# Patient Record
Sex: Female | Born: 1960 | Race: White | Hispanic: No | Marital: Married | Smoking: Former smoker
Health system: Southern US, Community
[De-identification: ages and names within clinical notes are randomized; demographics above are authoritative.]

## PROBLEM LIST (undated history)

## (undated) DIAGNOSIS — J189 Pneumonia, unspecified organism: Secondary | ICD-10-CM

## (undated) DIAGNOSIS — K589 Irritable bowel syndrome without diarrhea: Secondary | ICD-10-CM

## (undated) DIAGNOSIS — IMO0002 Reserved for concepts with insufficient information to code with codable children: Secondary | ICD-10-CM

## (undated) DIAGNOSIS — K449 Diaphragmatic hernia without obstruction or gangrene: Secondary | ICD-10-CM

## (undated) DIAGNOSIS — S060XAA Concussion with loss of consciousness status unknown, initial encounter: Secondary | ICD-10-CM

## (undated) DIAGNOSIS — H4020X0 Unspecified primary angle-closure glaucoma, stage unspecified: Secondary | ICD-10-CM

## (undated) DIAGNOSIS — S060X9A Concussion with loss of consciousness of unspecified duration, initial encounter: Secondary | ICD-10-CM

## (undated) HISTORY — DX: Reserved for concepts with insufficient information to code with codable children: IMO0002

## (undated) HISTORY — DX: Concussion with loss of consciousness of unspecified duration, initial encounter: S06.0X9A

## (undated) HISTORY — PX: OTHER SURGICAL HISTORY: SHX169

## (undated) HISTORY — DX: Pneumonia, unspecified organism: J18.9

## (undated) HISTORY — DX: Concussion with loss of consciousness status unknown, initial encounter: S06.0XAA

## (undated) HISTORY — DX: Irritable bowel syndrome, unspecified: K58.9

## (undated) HISTORY — DX: Unspecified primary angle-closure glaucoma, stage unspecified: H40.20X0

## (undated) HISTORY — DX: Diaphragmatic hernia without obstruction or gangrene: K44.9

## (undated) HISTORY — PX: IRIDOTOMY / IRIDECTOMY: SHX165

---

## 1994-02-07 HISTORY — PX: THYROGLOSSAL DUCT CYST: SHX297

## 2012-09-04 ENCOUNTER — Other Ambulatory Visit: Payer: Self-pay

## 2012-09-04 DIAGNOSIS — Z1231 Encounter for screening mammogram for malignant neoplasm of breast: Secondary | ICD-10-CM

## 2012-09-24 ENCOUNTER — Ambulatory Visit
Admission: RE | Admit: 2012-09-24 | Discharge: 2012-09-24 | Disposition: A | Discharge status: Home / Self Care | Payer: BC Managed Care – PPO | Source: Ambulatory Visit

## 2012-09-24 DIAGNOSIS — Z1231 Encounter for screening mammogram for malignant neoplasm of breast: Secondary | ICD-10-CM

## 2012-10-29 ENCOUNTER — Encounter: Payer: Self-pay | Admitting: Certified Nurse Midwife

## 2012-10-29 ENCOUNTER — Ambulatory Visit (INDEPENDENT_AMBULATORY_CARE_PROVIDER_SITE_OTHER): Payer: BC Managed Care – PPO | Admitting: Certified Nurse Midwife

## 2012-10-29 VITALS — BP 120/64 | HR 64 | Resp 16 | Ht 61.75 in | Wt 170.0 lb

## 2012-10-29 DIAGNOSIS — Z0000 Encounter for general adult medical examination without abnormal findings: Secondary | ICD-10-CM

## 2012-10-29 DIAGNOSIS — Z01419 Encounter for gynecological examination (general) (routine) without abnormal findings: Secondary | ICD-10-CM

## 2012-10-29 DIAGNOSIS — N952 Postmenopausal atrophic vaginitis: Secondary | ICD-10-CM

## 2012-10-29 NOTE — Progress Notes (Signed)
52 y.o. G42P0010 Married Caucasian Fe here for annual exam. Menopausal since 6/12. Denies vaginal bleeding. Complaining of pain with sexual activity and vaginal dryness. Has tried OTC products, but not with much success. Denies vaginal itching or burning, or change in vaginal discharge. o new personal products. PCP visit yesterday for aex,,with all labs.  Patient's last menstrual period was 07/09/2010.          Sexually active: yes  The current method of family planning is vasectomy.    Exercising: yes  cardio & weights Smoker:  no  Health Maintenance: Pap:  Yrs ago no abnormal MMG:  10-04-12 normal per patient Colonoscopy:  None, scheduled for consultation next week BMD:   none TDaP: 7/13 Labs: none Self breast exam: done occ Former smoker 21 years ago   reports that she has quit smoking. She does not have any smokeless tobacco history on file. She reports that she does not use illicit drugs.  Past Medical History  Diagnosis Date  . Dyspareunia     dryness    Past Surgical History  Procedure Laterality Date  . 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 Other surgical history      adenoids only age 8    Current Outpatient Prescriptions  Medication Sig Dispense Refill  . B Complex Vitamins (B COMPLEX PO) Take by mouth. Takes 2 daily      . desonide (DESOWEN) 0.05 % cream 2 (two) times daily as needed.      . Ibuprofen (ADVIL PO) Take by mouth as needed.      . Multiple Vitamins-Minerals (MULTIVITAMIN PO) Take by mouth daily.      . Probiotic Product (PROBIOTIC PO) Take by mouth daily.      Marland Kitchen UNABLE TO FIND daily. otc shaklee cholesterol reduction       No current facility-administered medications for this visit.    Family History  Problem Relation Age of Onset  . Hypertension Mother   . Diabetes Father   . Hypertension Father   . Heart disease Father   . Diabetes Maternal Uncle   . Hypertension Maternal Grandmother   . Stroke Maternal Grandmother   . Breast  cancer Paternal Grandmother   . Hypertension Paternal Grandmother   . Thyroid disease Paternal Grandmother     ROS:  Pertinent items are noted in HPI.  Otherwise, a comprehensive ROS was negative.  Exam:   BP 120/64  Pulse 64  Resp 16  Ht 5' 1.75" (1.568 m)  Wt 170 lb (77.111 kg)  BMI 31.36 kg/m2  LMP 07/09/2010 Height: 5' 1.75" (156.8 cm)  Ht Readings from Last 3 Encounters:  10/29/12 5' 1.75" (1.568 m)    General appearance: alert, cooperative and appears stated age Head: Normocephalic, without obvious abnormality, atraumatic Neck: no adenopathy, supple, symmetrical, trachea midline and thyroid normal to inspection and palpation Lungs: clear to auscultation bilaterally Breasts: normal appearance, no masses or tenderness, No nipple retraction or dimpling, No nipple discharge or bleeding, No axillary or supraclavicular adenopathy Heart: regular rate and rhythm Abdomen: soft, non-tender; no masses,  no organomegaly Extremities: extremities normal, atraumatic, no cyanosis or edema Skin: Skin color, texture, turgor normal. No rashes or lesions Lymph nodes: Cervical, supraclavicular, and axillary nodes normal. No abnormal inguinal nodes palpated Neurologic: Grossly normal   Pelvic: External genitalia:  no lesions              Urethra:  normal appearing urethra with no masses, tenderness or lesions  Bartholin's and Skene's: normal                 Vagina:atrophic appearing vagina with normal color and slight yellow discharge, no lesions ph 4.5 Wet prep taken              Cervix: normal, non tender              Pap taken: yes Bimanual Exam:  Uterus:  normal size, contour, position, consistency, mobility, non-tender and anteverted              Adnexa: normal adnexa and no mass, fullness, tenderness               Rectovaginal: Confirms               Anus:  normal sphincter tone, no lesions  Wet prep: Negative for Yeast, BV, Trich  A:  Well Woman with normal  exam  Menopausal no HRT  Atrophic Vaginitis  Family history of Breast cancer PGM(84)  Strong family history of hypertension   P:   Reviewed health and wellness pertinent to exam  Discussed importance of notification if vaginal bleeding  Discussed negative wet prep and findings of atrophic vaginitis. Discussed options for treatment of Estrogen cream, tablet, or ring, risks/benefits and treatment regimen.  Discussed OTC products of Replens, Vagisil moisturizer, Olive oil use. Patient prefers non extrogen product. Instructions given for Olive Oil with 3cc syringe, no needle(obtain form pharmacy) every hs for 2 weeks and then 3 times weekly.  Apply small amount at introitus daily.  Avoid intercourse for 2 weeks and then use moisturizer for sexual activity.  Re check here in 2 weeks.  Pap smear as per guidelines   Mammogram yearly pap smear taken today with HPVHR  counseled on breast self exam, mammography screening, menopause, adequate intake of calcium and vitamin D, diet and exercise, Kegel's exercises  return annually or prn as above  An After Visit Summary was printed and given to the patient.

## 2012-10-29 NOTE — Patient Instructions (Signed)

## 2012-10-30 NOTE — Progress Notes (Signed)
Note reviewed, agree with plan.  Jaculin Rasmus, MD  

## 2012-11-01 LAB — IPS PAP TEST WITH HPV

## 2012-11-28 ENCOUNTER — Ambulatory Visit (INDEPENDENT_AMBULATORY_CARE_PROVIDER_SITE_OTHER): Payer: BC Managed Care – PPO | Admitting: Certified Nurse Midwife

## 2012-11-28 ENCOUNTER — Encounter: Payer: Self-pay | Admitting: Certified Nurse Midwife

## 2012-11-28 VITALS — BP 130/64 | HR 80 | Ht 61.75 in | Wt 171.0 lb

## 2012-11-28 DIAGNOSIS — N952 Postmenopausal atrophic vaginitis: Secondary | ICD-10-CM

## 2012-11-28 MED ORDER — ESTRADIOL 10 MCG VA TABS: 1.0000 | ORAL_TABLET | VAGINAL | Status: DC

## 2012-11-28 NOTE — Progress Notes (Signed)
52 y.o. Married Caucasian female G1P0010 here for follow up of atrophic vaginitis treated with Olive oil daily for 3 weeks initiated on September 22,2014. Patient used as directed and still has dryness and pain with intercourse. Patient did not want estrogen use previously due to friends conversation, but has read herself and feels better about use if needed. Patient used OTC lubricant for sexual activity,but still had discomfort. Spouse also has ED, using medication. Patient complaining of irritation with scant discharge after sexual activity. No new personal products. [place]: Healthy WD,WN female Affect: normal, orientation x 3  Pelvic exam:EXTERNAL GENITALIA: vulva with no masses,  Atrophic appearance VAGINA: discharge scant, Wet Prep/KOH no pathogens, pH 4.5 and red, thin and atrophic with slight tenderness CERVIX: no lesions or cervical motion tenderness, normal appearance  A:Atrophic Vaginitis with Olive Oil use no change Negative wet prep   P: Discussed findings of negative wet prep and atrophic vaginitis. Discussed etiology of and reviewed handout with treatment option of estrogen use. Discussed risk and benefits, patient would like to try. Rx Vagifem see order, Instructions given to use nightly x 2 weeks and 2 x weekly, no sexual activity. Instructed to pat when cleaning after urination, instead of vigorous rubbing. Questions addressed.  RV recheck in 4 weeks

## 2012-12-02 NOTE — Progress Notes (Signed)
Note reviewed, agree with plan.  Esteban Kobashigawa, MD  

## 2012-12-31 ENCOUNTER — Ambulatory Visit (INDEPENDENT_AMBULATORY_CARE_PROVIDER_SITE_OTHER): Payer: BC Managed Care – PPO | Admitting: Certified Nurse Midwife

## 2012-12-31 VITALS — BP 110/60 | HR 68 | Resp 16 | Ht 61.75 in

## 2012-12-31 DIAGNOSIS — N952 Postmenopausal atrophic vaginitis: Secondary | ICD-10-CM

## 2012-12-31 NOTE — Progress Notes (Signed)
52 y.o. Married Caucasian female G1P0010 here for follow up atrophic vaginitis treated with Vagifem  initiated on 11/28/12. Patient using medication as prescribed and denies any problems with use. "So much better". Tried intercourse and had minimal pain or discomfort, did not use lubrication. Denies any vaginal bleeding or vaginal dryness, or breast tenderness. Happy with choice, desires continuance.   O: Healthy WD,WN female  Pelvic exam:EXTERNAL GENITALIA: normal appearing vulva with no masses, tenderness or lesions, atrophic appearance VAGINA: moist with slight increase of pink, slight tenderness, no lesions noted CERVIX: not tender, normal appearance   A: Atrophic vaginitis improving with Vagifem use desires continuance    P: Discussed findings of vaginal improvement and recommendation to continue use. Patient agreeable. Recommended vaginal moisturizer for sexual activity. Has Rx for Vagifem.    RV 6 months, prn

## 2013-01-02 NOTE — Progress Notes (Signed)
Note reviewed, agree with plan.  Akito Boomhower, MD  

## 2013-02-27 ENCOUNTER — Encounter: Payer: Self-pay | Admitting: Certified Nurse Midwife

## 2013-07-03 ENCOUNTER — Ambulatory Visit: Payer: BC Managed Care – PPO | Admitting: Certified Nurse Midwife

## 2013-07-09 ENCOUNTER — Ambulatory Visit: Payer: BC Managed Care – PPO | Admitting: Certified Nurse Midwife

## 2013-07-09 ENCOUNTER — Ambulatory Visit (INDEPENDENT_AMBULATORY_CARE_PROVIDER_SITE_OTHER): Payer: BC Managed Care – PPO | Admitting: Certified Nurse Midwife

## 2013-07-09 VITALS — BP 140/62 | HR 72 | Resp 16 | Ht 61.75 in | Wt 171.6 lb

## 2013-07-09 DIAGNOSIS — N952 Postmenopausal atrophic vaginitis: Secondary | ICD-10-CM

## 2013-07-09 NOTE — Patient Instructions (Signed)
Atrophic Vaginitis Atrophic vaginitis is a problem of low levels of estrogen in women. This problem can happen at any age. It is most common in women who have gone through menopause ("the change").  HOW WILL I KNOW IF I HAVE THIS PROBLEM? You may have:  Trouble with peeing (urinating), such as:  Going to the bathroom often.  A hard time holding your pee until you reach a bathroom.  Leaking pee.  Having pain when you pee.  Itching or a burning feeling.  Vaginal bleeding and spotting.  Pain during sex.  Dryness of the vagina.  A yellow, bad-smelling fluid (discharge) coming from the vagina. HOW WILL MY DOCTOR CHECK FOR THIS PROBLEM?  During your exam, your doctor will likely find the problem.  If there is a vaginal fluid, it may be checked for infection. HOW WILL THIS PROBLEM BE TREATED? Keep the vulvar skin as clean as possible. Moisturizers and lubricants can help with some of the symptoms. Estrogen replacement can help. There are 2 ways to take estrogen:  Systemic estrogen gets estrogen to your whole body. It takes many weeks or months before the symptoms get better.  You take an estrogen pill.  You use a skin patch. This is a patch that you put on your skin.  If you still have your uterus, your doctor may ask you to take a hormone. Talk to your doctor about the right medicine for you.  Estrogen cream.  This puts estrogen only at the part of your body where you apply it. The cream is put into the vagina or put on the vulvar skin. For some women, estrogen cream works faster than pills or the patch. CAN ALL WOMEN WITH THIS PROBLEM USE ESTROGEN? No. Women with certain types of cancer, liver problems, or problems with blood clots should not take estrogen. Your doctor can help you decide the best treatment for your symptoms. Document Released: 07/13/2007 Document Revised: 04/18/2011 Document Reviewed: 07/13/2007 [employer] Patient Information 2014 [employer].  

## 2013-07-09 NOTE — Progress Notes (Signed)
53 y.o. Married Caucasian female G1P0010 here for follow up of Atrophic vaginitis treated with Vagifem 10 mcg  initiated on October 22,2014. Using Vagifem as directed, but continues to have some dryness with sexual activity and other times. Denies vaginal itching or burning or new personal products or health changes. Patient has not been using vaginal moisturizer for sexual activity. "Feels it was working better at recheck in 11/14".  O: Healthy WD,WN female Affect: normal, orientation x 3 Abdomen:soft not tender Pelvic exam:EXTERNAL GENITALIA: normal appearing vulva with no masses, tenderness or lesions, slight atrophic appearance VAGINA: no abnormal discharge or lesions and moisture noted. Increased pink and  tenderness noted at introitus, patient states" this is the area of discomfort" CERVIX: no lesions or cervical motion tenderness and normal appearance UTERUS: mid and normal, non tender ADNEXA: no masses palpable and non tender  A: Atrophic vaginitis using Vagifem with good results Vaginal dryness at introitus    P: Discussed findings of improved vaginal appearance with Vagifem use and area of dryness shown to patient in the mirror. Discussed changing product she is using, but feel it is working appropriately, but need to increase frequency to 3 times weekly. Discussed importance of vaginal moisture for area of dryness and tenderness. Patient will try Olive Oil or Coconut oil daily and with sexual activity. Patient has Rx but will be using mail order and will advise of information so it can be sent to them. Patient to advise if this working well or not and will address when she needs new Rx or change. If patient needs to continue on Vagifem  3 times weekly will need cyclic progesterone twice yearly.   RV prn or if no change.

## 2013-07-10 ENCOUNTER — Telehealth: Payer: Self-pay | Admitting: Certified Nurse Midwife

## 2013-07-10 DIAGNOSIS — N952 Postmenopausal atrophic vaginitis: Secondary | ICD-10-CM

## 2013-07-11 NOTE — Telephone Encounter (Signed)
Pt calling to see if her prescription has been called into the pharmacy yet. [facility] [address] and they have not received it  Pt states she is completely out. Please call patient to let her know when this prescription willl be sent to the pharmacy

## 2013-07-11 NOTE — Telephone Encounter (Signed)
Last refill 11/28/12  #18/12 refills sent to [facility] [address]

## 2013-07-11 NOTE — Telephone Encounter (Signed)
[facility] [address]  Patient calling re: Vagifem will not be discounted for mail order. Patient requests RX be sent to local pharmacy instead.

## 2013-07-12 MED ORDER — ESTRADIOL 10 MCG VA TABS: 1.0000 | ORAL_TABLET | VAGINAL | Status: DC

## 2013-07-12 NOTE — Telephone Encounter (Signed)
Rx sent to [facility] [address] #12/ 3 refills to last until her appt 11/05/2013. - Patient notified.  - Ms Jessica Meza changed dose 07/09/13 to 3 times weekly that's why pt was out of Rx.

## 2013-07-12 NOTE — Telephone Encounter (Signed)
Pt calling to see if her prescription has been called into the pharmacy yet. [facility] [address] and they have not received it Pt states she is completely out. Please call patient to let her know when this prescription willl be sent to the pharmacy  Patient said that her dosage had been upped by Debbi and that is why she doesn't have anymore.

## 2013-07-24 ENCOUNTER — Encounter: Payer: Self-pay | Admitting: Certified Nurse Midwife

## 2013-07-24 NOTE — Progress Notes (Signed)
If ends up on three times weekly Vagifem, should probably use cyclic provera twice yearly.  Reviewed personally.  Lum KeasM. Suzanne Connelly [place], MD.

## 2013-09-13 ENCOUNTER — Other Ambulatory Visit: Payer: Self-pay

## 2013-09-13 DIAGNOSIS — Z1231 Encounter for screening mammogram for malignant neoplasm of breast: Secondary | ICD-10-CM

## 2013-09-25 ENCOUNTER — Ambulatory Visit
Admission: RE | Admit: 2013-09-25 | Discharge: 2013-09-25 | Disposition: A | Discharge status: Home / Self Care | Payer: BC Managed Care – PPO | Source: Ambulatory Visit

## 2013-09-25 DIAGNOSIS — Z1231 Encounter for screening mammogram for malignant neoplasm of breast: Secondary | ICD-10-CM

## 2013-10-08 ENCOUNTER — Emergency Department (HOSPITAL_COMMUNITY)
Admission: EM | Admit: 2013-10-08 | Discharge: 2013-10-08 | Disposition: A | Discharge status: Home / Self Care | Payer: BC Managed Care – PPO | Attending: Emergency Medicine | Admitting: Emergency Medicine

## 2013-10-08 ENCOUNTER — Encounter (HOSPITAL_COMMUNITY): Payer: Self-pay | Admitting: Emergency Medicine

## 2013-10-08 DIAGNOSIS — Z87448 Personal history of other diseases of urinary system: Secondary | ICD-10-CM | POA: Diagnosis not present

## 2013-10-08 DIAGNOSIS — Z87891 Personal history of nicotine dependence: Secondary | ICD-10-CM | POA: Diagnosis not present

## 2013-10-08 DIAGNOSIS — Y929 Unspecified place or not applicable: Secondary | ICD-10-CM | POA: Diagnosis not present

## 2013-10-08 DIAGNOSIS — Y9389 Activity, other specified: Secondary | ICD-10-CM | POA: Insufficient documentation

## 2013-10-08 DIAGNOSIS — Z79899 Other long term (current) drug therapy: Secondary | ICD-10-CM | POA: Diagnosis not present

## 2013-10-08 DIAGNOSIS — F0781 Postconcussional syndrome: Secondary | ICD-10-CM | POA: Insufficient documentation

## 2013-10-08 DIAGNOSIS — S0990XA Unspecified injury of head, initial encounter: Secondary | ICD-10-CM | POA: Insufficient documentation

## 2013-10-08 DIAGNOSIS — IMO0002 Reserved for concepts with insufficient information to code with codable children: Secondary | ICD-10-CM | POA: Diagnosis not present

## 2013-10-08 MED ORDER — ACETAMINOPHEN 325 MG PO TABS
975.0000 mg | ORAL_TABLET | Freq: Once | ORAL | Status: AC
  Administered 2013-10-08: 975 mg via ORAL
  Filled 2013-10-08: qty 3

## 2013-10-08 MED ORDER — SODIUM CHLORIDE 0.9 % IV BOLUS (SEPSIS)
1000.0000 mL | Freq: Once | INTRAVENOUS | Status: AC
  Administered 2013-10-08: 1000 mL via INTRAVENOUS

## 2013-10-08 MED ORDER — ONDANSETRON HCL 4 MG PO TABS: 4.0000 mg | ORAL_TABLET | Freq: Three times a day (TID) | ORAL | Status: DC | PRN

## 2013-10-08 MED ORDER — KETOROLAC TROMETHAMINE 30 MG/ML IJ SOLN
30.0000 mg | Freq: Once | INTRAMUSCULAR | Status: AC
  Administered 2013-10-08: 30 mg via INTRAVENOUS
  Filled 2013-10-08: qty 1

## 2013-10-08 MED ORDER — ONDANSETRON HCL 4 MG/2ML IJ SOLN
4.0000 mg | Freq: Once | INTRAMUSCULAR | Status: AC
  Administered 2013-10-08: 4 mg via INTRAVENOUS
  Filled 2013-10-08: qty 2

## 2013-10-08 MED ORDER — IBUPROFEN 800 MG PO TABS: 800.0000 mg | ORAL_TABLET | Freq: Three times a day (TID) | ORAL | Status: DC | PRN

## 2013-10-08 NOTE — ED Provider Notes (Signed)
Medical screening examination/treatment/procedure(s) were performed by non-physician practitioner and as supervising physician I was immediately available for consultation/collaboration.   EKG Interpretation None       Ethelda Chick, MD 10/08/13 1352

## 2013-10-08 NOTE — ED Provider Notes (Signed)
CSN: [id number]     Arrival date & time 10/08/13  1017 History   First MD Initiated Contact with Patient 10/08/13 1109     No chief complaint on file.    (Consider location/radiation/quality/duration/timing/severity/associated sxs/prior Treatment) HPI Patient presents to the emergency department with headache following a head injury that occurred one week ago.  The patient, states she struck her head against the side of her car after tripping over her dog.  Patient, states, that she's had intermittent headache, with some mild dizziness, nausea, and blurred vision.  The patient, states, that she did not lose consciousness when she struck her head.  Patient, states, that Tylenol and Motrin haven't helped with her headache.  Patient, states she has had difficulty with appetite and sleeping.  The patient denies chest pain, shortness of breath, weakness, back pain, dysuria, incontinence, numbness, vomiting, diarrhea, fever, or syncope.  The patient, states, that nothing seems to make her condition worse Past Medical History  Diagnosis Date  . Dyspareunia     dryness   Past Surgical History  Procedure Laterality Date  . 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 Other surgical history      adenoids only age 26   Family History  Problem Relation Age of Onset  . Hypertension Mother   . Diabetes Father   . Hypertension Father   . Heart disease Father   . Diabetes Maternal Uncle   . Hypertension Maternal Grandmother   . Stroke Maternal Grandmother   . Breast cancer Paternal Grandmother   . Hypertension Paternal Grandmother   . Thyroid disease Paternal Grandmother    History  Substance Use Topics  . Smoking status: Former Games developer  . Smokeless tobacco: Not on file  . Alcohol Use: 0.0 - 0.5 oz/week    0-1 drink(s) per week   OB History   Grav Para Term Preterm Abortions TAB SAB Ect Mult Living   1 0   1 1         Review of Systems  All other systems negative except as documented in  the HPI. All pertinent positives and negatives as reviewed in the HPI.  Allergies  Review of patient's allergies indicates no known allergies.  Home Medications   Prior to Admission medications   Medication Sig Start Date End Date Taking? Authorizing Provider  acetaminophen (TYLENOL) 500 MG tablet Take 1,000 mg by mouth every 4 (four) hours as needed for headache.   Yes Historical Provider, MD  B Complex Vitamins (B COMPLEX PO) Take by mouth. Takes 2 daily   Yes Historical Provider, MD  desonide (DESOWEN) 0.05 % cream 2 (two) times daily as needed. 10/17/12  Yes Historical Provider, MD  Estradiol (VAGIFEM) 10 MCG TABS vaginal tablet Place 1 tablet (10 mcg total) vaginally 3 (three) times a week. 07/12/13  Yes Verner Chol, CNM  ibuprofen (ADVIL,MOTRIN) 200 MG tablet Take 400 mg by mouth every 6 (six) hours as needed for moderate pain.   Yes Historical Provider, MD  Multiple Vitamins-Minerals (MULTIVITAMIN PO) Take by mouth daily.   Yes Historical Provider, MD  polyvinyl alcohol-povidone (REFRESH) 1.4-0.6 % ophthalmic solution Place 1 drop into both eyes every morning.   Yes Historical Provider, MD  Probiotic Product (PROBIOTIC PO) Take by mouth daily.   Yes Historical Provider, MD  UNABLE TO FIND daily. otc shaklee cholesterol reduction   Yes Historical Provider, MD   BP 173/71  Pulse 95  Temp(Src) 98.6 F (37 C) (Oral)  Resp 16  SpO2 100%  LMP 07/09/2010 Physical Exam  Nursing note and vitals reviewed. Constitutional: She is oriented to person, place, and time. She appears well-developed and well-nourished.  HENT:  Head: Normocephalic and atraumatic.  Mouth/Throat: Oropharynx is clear and moist.  Eyes: Conjunctivae and EOM are normal. Pupils are equal, round, and reactive to light.  Neck: Normal range of motion. Neck supple.  Cardiovascular: Normal rate, regular rhythm and normal heart sounds.  Exam reveals no gallop and no friction rub.   No murmur heard. Pulmonary/Chest:  Effort normal and breath sounds normal.  Neurological: She is alert and oriented to person, place, and time. She has normal strength and normal reflexes. No sensory deficit. She exhibits normal muscle tone. She displays a negative Romberg sign. Coordination and gait normal. GCS eye subscore is 4. GCS verbal subscore is 5. GCS motor subscore is 6.  Skin: Skin is warm and dry. No rash noted.    ED Course  Procedures (including critical care time) Labs Review Labs Reviewed - No data to display  Patient is explained postconcussive syndrome, and advised, that the symptoms seem fairly consistent with this diagnosis.  She is also advised, that even, if the week, and she has not had significant alterations in her normal mentation and neurological status.  CT scan.  This time isn't necessarily warranted.  I did leave the option open to the patient, but she did state that she felt comfortable about not doing a CT scan on this visit.  This patient.  Follow up with her primary care Dr. and return here as needed.  Also educated her on the symptoms she may experience and possible duration of these voices an understanding of the plan and all questions were answered   Carlyle Dolly, PA-C 10/08/13 1345

## 2013-10-08 NOTE — Discharge Instructions (Signed)
Return here for any worsening in your condition.  Followup with your primary care doctor. take Tylenol for headache

## 2013-10-08 NOTE — ED Notes (Addendum)
Pt hit her head 7 days ago on the roof of her car when she tripped over her dog. Pt did not have external blood, bruising or knot. Reports headaches and nausea last week and nauseated today. "my head feels foggy and I felt like I was going to pass out yesterday." Also says "I just don't feel right".

## 2013-10-08 NOTE — ED Notes (Signed)
MD at bedside. 

## 2013-10-08 NOTE — ED Notes (Signed)
Family at bedside. 

## 2013-11-05 ENCOUNTER — Telehealth: Payer: Self-pay | Admitting: Certified Nurse Midwife

## 2013-11-05 ENCOUNTER — Ambulatory Visit (INDEPENDENT_AMBULATORY_CARE_PROVIDER_SITE_OTHER): Payer: BC Managed Care – PPO | Admitting: Certified Nurse Midwife

## 2013-11-05 ENCOUNTER — Encounter: Payer: Self-pay | Admitting: Certified Nurse Midwife

## 2013-11-05 VITALS — BP 120/78 | HR 74 | Resp 16 | Ht 61.75 in | Wt 174.0 lb

## 2013-11-05 DIAGNOSIS — N952 Postmenopausal atrophic vaginitis: Secondary | ICD-10-CM

## 2013-11-05 DIAGNOSIS — Z01419 Encounter for gynecological examination (general) (routine) without abnormal findings: Secondary | ICD-10-CM

## 2013-11-05 MED ORDER — ESTRADIOL 10 MCG VA TABS: 1.0000 | ORAL_TABLET | VAGINAL | Status: DC

## 2013-11-05 NOTE — Patient Instructions (Signed)

## 2013-11-05 NOTE — Telephone Encounter (Signed)
Pt needing a 3 month reck with Debbie around 12/29. Since schedule is block, please call pt to schedule. She would like to get in sooner that 02/12/14 which was offered to her.

## 2013-11-05 NOTE — Telephone Encounter (Signed)
Spoke with patient. Appointment scheduled for 1/5 at 9:15am with Verner Choleborah S. Leonard CNM. Patient is agreeable to date and time.  Routing to provider for final review. Patient agreeable to disposition. Will close encounter

## 2013-11-05 NOTE — Telephone Encounter (Signed)
Left message to call Kaydin Labo at [phone]. 

## 2013-11-05 NOTE — Progress Notes (Signed)
53 y.o. [id number] Married Caucasian Fe here for annual exam. Menopausal no HRT. Patient denies vaginal bleeding Patient using Vagifem for vaginal dryness with good response. PCP visit is in two weeks and will have all labs done. No health issues today.  Patient's last menstrual period was 07/09/2010.          Sexually active: Yes.    The current method of family planning is vasectomy.    Exercising: Yes.    cardio,bike,treadmill,weights Smoker:  no  Health Maintenance: Pap:  10-29-12 neg HPV HR neg MMG: 09-25-13 density category b,birads category 1:neg Colonoscopy: 11/14 one small polyp, f/u 2764yrs BMD:   none TDaP:  2013 Labs: none Self breast exam: done occ   reports that she has quit smoking. She does not have any smokeless tobacco history on file. She reports that she drinks about .5 - 1 ounces of alcohol per week. She reports that she does not use illicit drugs.  Past Medical History  Diagnosis Date  . Dyspareunia     dryness  . Concussion     09-30-13    Past Surgical History  Procedure Laterality Date  . 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 Other surgical history      adenoids only age 53    Current Outpatient Prescriptions  Medication Sig Dispense Refill  . B Complex Vitamins (B COMPLEX PO) Take by mouth. Takes 2 daily      . desonide (DESOWEN) 0.05 % cream 2 (two) times daily as needed.      . Estradiol (VAGIFEM) 10 MCG TABS vaginal tablet Place 1 tablet (10 mcg total) vaginally 3 (three) times a week.  12 tablet  3  . ibuprofen (ADVIL,MOTRIN) 400 MG tablet Take 400 mg by mouth every 6 (six) hours as needed.      . Multiple Vitamins-Minerals (MULTIVITAMIN PO) Take by mouth daily.      . polyvinyl alcohol-povidone (REFRESH) 1.4-0.6 % ophthalmic solution Place 1 drop into both eyes every morning.      . Probiotic Product (PROBIOTIC PO) Take by mouth daily.      Marland Kitchen. UNABLE TO FIND daily. otc shaklee cholesterol reduction       No current facility-administered  medications for this visit.    Family History  Problem Relation Age of Onset  . Hypertension Mother   . Diabetes Father   . Hypertension Father   . Heart disease Father   . Diabetes Maternal Uncle   . Hypertension Maternal Grandmother   . Stroke Maternal Grandmother   . Breast cancer Paternal Grandmother   . Hypertension Paternal Grandmother   . Thyroid disease Paternal Grandmother     ROS:  Pertinent items are noted in HPI.  Otherwise, a comprehensive ROS was negative.  Exam:   BP 120/78  Pulse 74  Resp 16  Ht 5' 1.75" (1.568 m)  Wt 174 lb (78.926 kg)  BMI 32.10 kg/m2  LMP 07/09/2010 Height: 5' 1.75" (156.8 cm)  Ht Readings from Last 3 Encounters:  11/05/13 5' 1.75" (1.568 m)  07/09/13 5' 1.75" (1.568 m)  12/31/12 5' 1.75" (1.568 m)    General appearance: alert, cooperative and appears stated age Head: Normocephalic, without obvious abnormality, atraumatic Neck: no adenopathy, supple, symmetrical, trachea midline and thyroid normal to inspection and palpation Lungs: clear to auscultation bilaterally Breasts: normal appearance, no masses or tenderness, No nipple retraction or dimpling, No nipple discharge or bleeding, No axillary or supraclavicular adenopathy Heart: regular rate and rhythm  Abdomen: soft, non-tender; no masses,  no organomegaly Extremities: extremities normal, atraumatic, no cyanosis or edema Skin: Skin color, texture, turgor normal. No rashes or lesions Lymph nodes: Cervical, supraclavicular, and axillary nodes normal. No abnormal inguinal nodes palpated Neurologic: Grossly normal   Pelvic: External genitalia:  no lesions              Urethra:  normal appearing urethra with no masses, tenderness or lesions              Bartholin's and Skene's: normal                 Vagina: normal appearing vagina with normal color and discharge, no lesions              Cervix: normal appearance, non tender              Pap taken: No. Bimanual Exam:  Uterus:   normal size, contour, position, consistency, mobility, non-tender and anteverted              Adnexa: normal adnexa and no mass, fullness, tenderness               Rectovaginal: Confirms               Anus:  normal sphincter tone, no lesions  A:  Well Woman with normal exam  Menopausal no HRT  Atrophic vaginitis with good response with Vagifem use 3 x weekly  P:   Reviewed health and wellness pertinent to exam  Aware of need to evaluate if vaginal bleeding  Rx Vagifem see order  Discussed recheck in 12/15 if needs to continue 3x weekly will need Provera for 10 days after visit. Patient agreeable and understands why necessary.  Pap smear taken not today   counseled on breast self exam, mammography screening, adequate intake of calcium and vitamin D, diet and exercise  return annually or prn  An After Visit Summary was printed and given to the patient.

## 2013-11-06 NOTE — Progress Notes (Signed)
Encounter reviewed by Dr. Brook Silva.  

## 2013-12-09 ENCOUNTER — Encounter: Payer: Self-pay | Admitting: Certified Nurse Midwife

## 2014-02-11 ENCOUNTER — Telehealth: Payer: Self-pay | Admitting: Certified Nurse Midwife

## 2014-02-11 ENCOUNTER — Ambulatory Visit: Payer: BC Managed Care – PPO | Admitting: Certified Nurse Midwife

## 2014-02-11 NOTE — Telephone Encounter (Signed)
lmtcb to reschedule 3 month recheck with Ortencia Kick. Leonard, CNM. The appointment was for today but was cancelled by the provider.  Cc: Joy

## 2014-02-13 NOTE — Telephone Encounter (Signed)
Pt is scheduled for 02/21/14

## 2014-02-21 ENCOUNTER — Ambulatory Visit (INDEPENDENT_AMBULATORY_CARE_PROVIDER_SITE_OTHER): Payer: BLUE CROSS/BLUE SHIELD | Admitting: Certified Nurse Midwife

## 2014-02-21 ENCOUNTER — Encounter: Payer: Self-pay | Admitting: Certified Nurse Midwife

## 2014-02-21 VITALS — BP 126/80 | HR 84 | Resp 14 | Ht 61.75 in | Wt 171.6 lb

## 2014-02-21 DIAGNOSIS — N952 Postmenopausal atrophic vaginitis: Secondary | ICD-10-CM

## 2014-02-21 MED ORDER — MEDROXYPROGESTERONE ACETATE 10 MG PO TABS
10.0000 mg | ORAL_TABLET | Freq: Every day | ORAL | Status: DC
Start: 2014-02-21 — End: 2014-05-26

## 2014-02-21 MED ORDER — ESTRADIOL 10 MCG VA TABS: 1.0000 | ORAL_TABLET | VAGINAL | Status: DC

## 2014-02-21 NOTE — Progress Notes (Signed)
Reviewed personally.  M. Suzanne Cierria Height, MD.  

## 2014-02-21 NOTE — Progress Notes (Signed)
54 y.o. Married Caucasian G1P0010here for evaluation of atrophic vaginitis being treated with Vagifem 3 times weekly initiated on 11/05/13 for vaginal dryness. Patient is menopausal with no HRT use. She had not used 3 x weekly consistently until the past 5 months. Using Coconut oil also for dryness and with sexual activity. Patient feels she has improved, " but not there yet". She is using Coconut oil at the same time with Vagifem. Here for exam to check tissue. Patient also recently diagnosed with right rotator tear, considering surgery. No other health issues today.  O: Healthy female, WD WN Affect: normal orientation X 3   Pelvic exam: External genital area: normal female, no lesions or redness or tenderness Vagina: good moisture noted, slight thinning of tissue, slight tenderness, slight atrophic appearance Cervix: normal, non tender Uterus: normal, non tender Adnexa: normal, non tender, no masses or fullness    A: History of vaginal dryness with  Vagifem working well to resolve. Right rotator cuff under evaluation   P: Discussed improvement noted in tissue, but recommend continue Vagifem 3 times weekly and use Coconut oil on days in between Vagifem to see if this resolved any other problems. Also changing position with sexual activity where she has more control of entrance into vagina. Discussed need Rx Provera 10 mg x 10 days to avoid endometrial hyperplasia risk with Vagifem. Patient agreeable. Rx Provera see order Patient will call if she does or does not have withdrawal bleeding. Continue follow up with shoulder as indicated.  Rv 3 months, prn

## 2014-02-21 NOTE — Patient Instructions (Signed)

## 2014-03-10 ENCOUNTER — Telehealth: Payer: Self-pay | Admitting: Certified Nurse Midwife

## 2014-03-10 NOTE — Telephone Encounter (Signed)
Pt is calling to let Eunice BlaseDebbie know how she is doing after 2 weeks on the medication.

## 2014-03-11 NOTE — Telephone Encounter (Signed)
Agree up two weeks for bleeding, have her advise at that point.

## 2014-03-11 NOTE — Telephone Encounter (Signed)
Spoke with patient. Patient started taking provera on 02/22/14. Finished taking on 03/03/14. Patient calling to let Verner Choleborah S. Leonard CNM know she has not had any bleeding since completion. Advised patient can take up to two weeks after completion to see any bleeding with Provera. Advised patient would update Verner Choleborah S. Leonard CNM and she needs to monitor until 2/8. Advised to give us a call on 2/8 to let us know if she had any bleeding. Patient is agreeable.  Routing to provider for final review. Patient agreeable to disposition. Will close encounter

## 2014-03-28 ENCOUNTER — Telehealth: Payer: Self-pay | Admitting: Certified Nurse Midwife

## 2014-03-28 NOTE — Telephone Encounter (Signed)
Spoke with patient. Patient took Provera 1/16-1/25. Patient is calling to report that she did not have any bleeding. "She told me she may want to do this every three months or so. I have a follow up scheduled in April." Follow up scheduled on 4/18. Advised would let Verner Choleborah S. Leonard CNM know and return call if anything further needs to be done before follow up appointment on 4/18. Patient is agreeable.

## 2014-03-28 NOTE — Telephone Encounter (Signed)
Pt started on progesterone recently and was instructed to call back with how shes doing on it.

## 2014-03-30 NOTE — Telephone Encounter (Signed)
Will follow up at 4-16 visit

## 2014-03-31 NOTE — Telephone Encounter (Signed)
Spoke with patient. Advised patient of message as seen below from Deborah S. Leonard CNM. Patient is agreeable and verbalizes understanding.   Routing to provider for final review. Patient agreeable to disposition. Will close encounter   

## 2014-04-28 ENCOUNTER — Telehealth: Payer: Self-pay | Admitting: Certified Nurse Midwife

## 2014-04-28 DIAGNOSIS — N952 Postmenopausal atrophic vaginitis: Secondary | ICD-10-CM

## 2014-04-28 NOTE — Telephone Encounter (Signed)
Patient calling requesting a refill on Vagifem. Pharmacy on file is correct.

## 2014-04-28 NOTE — Telephone Encounter (Signed)
Reviewed thank you 

## 2014-04-28 NOTE — Telephone Encounter (Signed)
02/21/14 #15/4 rfs was sent electronically to [facility]. Called [facility] and s/w Genevive BiGeremy he said they do have the prescription and that he would fill it for the patient.  Called patient she is aware.  Routed to provider for review, encounter closed.

## 2014-05-13 ENCOUNTER — Other Ambulatory Visit: Payer: Self-pay | Admitting: *Deleted

## 2014-05-13 ENCOUNTER — Ambulatory Visit (HOSPITAL_COMMUNITY)
Admission: RE | Admit: 2014-05-13 | Discharge: 2014-05-13 | Disposition: A | Discharge status: Home / Self Care | Payer: BLUE CROSS/BLUE SHIELD | Source: Ambulatory Visit | Attending: Cardiology | Admitting: Cardiology

## 2014-05-13 DIAGNOSIS — R609 Edema, unspecified: Secondary | ICD-10-CM

## 2014-05-13 DIAGNOSIS — M7989 Other specified soft tissue disorders: Secondary | ICD-10-CM | POA: Diagnosis present

## 2014-05-13 NOTE — Progress Notes (Signed)
Right Upper Ext. Venous Duplex Completed. Negative for DVT or SVT in the right arm. Deasia Chiu, BS, RDMS, RVT  

## 2014-05-15 ENCOUNTER — Telehealth (HOSPITAL_COMMUNITY): Payer: Self-pay | Admitting: *Deleted

## 2014-05-26 ENCOUNTER — Encounter: Payer: Self-pay | Admitting: Certified Nurse Midwife

## 2014-05-26 ENCOUNTER — Ambulatory Visit (INDEPENDENT_AMBULATORY_CARE_PROVIDER_SITE_OTHER): Payer: BLUE CROSS/BLUE SHIELD | Admitting: Certified Nurse Midwife

## 2014-05-26 VITALS — BP 110/64 | HR 70 | Resp 16 | Ht 61.75 in | Wt 168.0 lb

## 2014-05-26 DIAGNOSIS — N952 Postmenopausal atrophic vaginitis: Secondary | ICD-10-CM

## 2014-05-26 DIAGNOSIS — N898 Other specified noninflammatory disorders of vagina: Secondary | ICD-10-CM | POA: Diagnosis not present

## 2014-05-26 NOTE — Patient Instructions (Signed)

## 2014-05-26 NOTE — Progress Notes (Signed)
54 y.o. Married Caucasian female G1P0010 here for follow up of atrophic vaginitis treated with Vagifem and coconut oil initiated on 11/05/13. Patient progressed to Vagifem 3 times weekly over the past 5 months, was seen on 02/21/14 for follow up and given Provera due to amount of dosage and decrease risk of endometrial hyperplasia. Patient completed and had no withdrawal bleeding or problems with use. Patient feels deep vaginal dryness issues has stopped, but introital area still a problem. Using Coconut oil 3 times weekly, but not with sexual activity every time. Spouse having ED issues with hypertensive medication and former prostate issues. So hand stimulation or oral is used frequently. Denies vaginal pain except feels irritated at times. Does admit this seems to be after sexual activity and ? Wearing jeans. Patient prefers coconut oil to Olive Oil. Desires to continue Vagifem and coconut oil. No other health issues today.   O: Healthy WD,WN female Affect: normal, orientation x 3  Abdomen:suprapubic non tender Inguinal lymph nodes non tender, no enlargement Pelvic exam:EXTERNAL GENITALIA: normal appearing vulva with no masses, tenderness or lesions on left, small superficial laceration on right, tender, no bleeding noted.  Shown area to patient and patient felt it was from last sexual activity. Affirm taken from area and inside vagina. VAGINA: moisture noted inside vaginal vault, no lacerations noted. CERVIX: no lesions or cervical motion tenderness and normal, non tender UTERUS: normal, non tender ADNEXA: no masses palpable and nontender  A:Normal pelvic exam Atrophic vaginitis improved with Vagifem Superficial vulva laceration Affirm taken to R/O vaginal pathogen  P: Discussed findings of normal pelvic exam. Discussed vaginal tissues with the exception of vulva area looks very normal in appearance with nice moisture noted and no areas of concern. Continue Vagifem but twice weekly now should  work well. If concerns advise.  Discussed need for moisture protection with hand stimulation. Discuss with spouse the need to use to protect her tissue from tearing. Encouraged patient to be open about pain when occurs.  Recheck in area in one week if still tender.   RV as above, prn

## 2014-05-27 LAB — WET PREP BY MOLECULAR PROBE
Candida species: NEGATIVE
Gardnerella vaginalis: NEGATIVE
Trichomonas vaginosis: NEGATIVE

## 2014-05-28 NOTE — Progress Notes (Signed)
Reviewed personally.  M. Suzanne Norely Schlick, MD.  

## 2014-06-02 ENCOUNTER — Encounter: Payer: Self-pay | Admitting: Certified Nurse Midwife

## 2014-06-02 ENCOUNTER — Ambulatory Visit (INDEPENDENT_AMBULATORY_CARE_PROVIDER_SITE_OTHER): Payer: BLUE CROSS/BLUE SHIELD | Admitting: Certified Nurse Midwife

## 2014-06-02 VITALS — BP 114/74 | HR 70 | Resp 16 | Ht 61.75 in | Wt 168.0 lb

## 2014-06-02 DIAGNOSIS — N952 Postmenopausal atrophic vaginitis: Secondary | ICD-10-CM

## 2014-06-02 DIAGNOSIS — S3141XD Laceration without foreign body of vagina and vulva, subsequent encounter: Secondary | ICD-10-CM

## 2014-06-02 MED ORDER — ESTROGENS, CONJUGATED 0.625 MG/GM VA CREA
TOPICAL_CREAM | VAGINAL | Status: DC
Start: 2014-06-02 — End: 2014-11-11

## 2014-06-02 NOTE — Progress Notes (Signed)
54 y.o. Married Caucasian female G1P0010 here for follow up of vaginal laceration from sexual activity.  Patient has been using coconut oil to area and Vagifem 2 x weekly. She feels the area is less sore, but not healed.  Has not used any other personal products.   O: Healthy WD,WN female Affect: normal, orientation x 3 Abdomen:soft non tender Pelvic exam:EXTERNAL GENITALIA: normal appearing vulva with small pink skin superficial laceration noted on right, less red than previous appointment, left appears the same.Non tender.  VAGINA: no abnormal discharge or lesions and moist BUS: negative Declines pelvic exam Dr. Hyacinth MeekerMiller examined patient with recommendation of estrogen cream to area to try.  A:Vulva superficial lacerations from sexual activity non tender, right reduced redness Atrophic vaginitis responding well to Vagifem and coconut oil use  P: Discussed findings of vulva area and recommendation of Premarin cream to area with instructions for use. Small amount daily for 7 days, if resolved can stop if not continue 2-3 times weekly until resolved. Continue Vagifem as directed. Questions addressed  Rx Premarin cream see order with instructions  Rv  4 weeks or prn

## 2014-06-03 NOTE — Progress Notes (Signed)
Reviewed personally.  M. Suzanne Kaisha Wachob, MD.  

## 2014-06-30 ENCOUNTER — Encounter: Payer: Self-pay | Admitting: Certified Nurse Midwife

## 2014-06-30 ENCOUNTER — Ambulatory Visit (INDEPENDENT_AMBULATORY_CARE_PROVIDER_SITE_OTHER): Payer: BLUE CROSS/BLUE SHIELD | Admitting: Certified Nurse Midwife

## 2014-06-30 VITALS — BP 120/70 | HR 80 | Resp 18 | Ht 61.75 in | Wt 173.0 lb

## 2014-06-30 DIAGNOSIS — N952 Postmenopausal atrophic vaginitis: Secondary | ICD-10-CM | POA: Diagnosis not present

## 2014-06-30 NOTE — Progress Notes (Signed)
54 y.o. Married Caucasian female G1P0010 here for follow up of laceration of vulva from sexual activity  treated with premarin cream initiated on 06/02/14. Patient was jointly seen at previous appointment with Dr. Hyacinth MeekerMiller agreed that the laceration was present and no biopsy needed. Patient used the Premarin Cream daily and then 2 x weekly at hs to area only. Using Vagifem for atrophic vaginitis with good response. Patient feels area has improved, but not totally resolved on right vulva. Slight groin itching from underwear, no rash or lesions. Denies any vaginal itching or burning. No pain with sexual activity. No other health issues today.   O: Healthy WD,WN female Affect: normal, orientation x 3 Skin:warm and dry Abdomen:soft, non tender Inguinal lymph nodes non tender, no enlargement Pelvic exam:EXTERNAL GENITALIA: normal appearing vulva with no masses, tenderness or lesions, small healing laceration, very small, no redness, healthy tissue appearance now, non tender VAGINA: no abnormal discharge or lesions, moist, non tender with exam now CERVIX: no lesions or cervical motion tenderness and normal appearance UTERUS: normal non tender ADNEXA: no masses palpable and nontender  A:Vulva laceration resolving Atrophic vaginitis improved significantly with vagifem Normal pelvic exam    P: Discussed findings of laceration healing and shown in mirror to patient. Reassured. Continue with Premarin cream and recheck in 3 weeks when Dr. Hyacinth MeekerMiller is here if concerns. Continue vagifem 2 x weekly as prescribed. Questions addressed.  Rv 3 weeks, prn

## 2014-07-01 NOTE — Progress Notes (Signed)
Reviewed personally.  M. Suzanne Branch Pacitti, MD.  

## 2014-07-24 ENCOUNTER — Ambulatory Visit (INDEPENDENT_AMBULATORY_CARE_PROVIDER_SITE_OTHER): Payer: BLUE CROSS/BLUE SHIELD | Admitting: Certified Nurse Midwife

## 2014-07-24 ENCOUNTER — Encounter: Payer: Self-pay | Admitting: Certified Nurse Midwife

## 2014-07-24 VITALS — BP 118/70 | HR 74 | Resp 20 | Ht 61.75 in | Wt 173.0 lb

## 2014-07-24 DIAGNOSIS — N952 Postmenopausal atrophic vaginitis: Secondary | ICD-10-CM

## 2014-07-24 NOTE — Progress Notes (Signed)
54 y.o. Married Caucasian female G1P0010 here for follow up of vaginal laceration treated with premarin cream initiated on 06/30/14. Using medication as directed.Vaginal dryness resolved. Using Premarin on area inside right labia, which has healed over now from superficial laceration."Feels so much better. Has stopped coconut oil external just with sexual activity now. Using Vagifem  2x weekly, which is working. Denies vaginal bleeding or pain now. No other concerns today.     O: Healthy WD,WN female Affect: normal Skin:warm and dry Abdomen:non tender Pelvic exam:EXTERNAL GENITALIA: normal appearing vulva with no masses, tenderness or lesions, area of concern on right labia is very small slight pink now and almost healed. Non tender. VAGINA: no abnormal discharge or lesions and moist, no superficial lacerations noted. Declines pelvic exam  A:Superficial laceration on right vulva healing and reduction in size, with Premarin cream use. Atrophic Vaginitis with good response to Vagifem    P: Discussed findings of healing and resolving area now. Continue with very small amount of Premarin cream until totally resolved. Come in any change. Continue Vagifem as directed and coconut oil for sexually activity.  RV prn, aex

## 2014-07-25 NOTE — Progress Notes (Signed)
Reviewed personally.  M. Suzanne Lokelani Lutes, MD.  

## 2014-09-04 ENCOUNTER — Other Ambulatory Visit: Payer: Self-pay

## 2014-09-04 DIAGNOSIS — Z1231 Encounter for screening mammogram for malignant neoplasm of breast: Secondary | ICD-10-CM

## 2014-09-29 ENCOUNTER — Ambulatory Visit
Admission: RE | Admit: 2014-09-29 | Discharge: 2014-09-29 | Disposition: A | Discharge status: Home / Self Care | Payer: BLUE CROSS/BLUE SHIELD | Source: Ambulatory Visit

## 2014-09-29 DIAGNOSIS — Z1231 Encounter for screening mammogram for malignant neoplasm of breast: Secondary | ICD-10-CM

## 2014-11-11 ENCOUNTER — Ambulatory Visit (INDEPENDENT_AMBULATORY_CARE_PROVIDER_SITE_OTHER): Payer: BLUE CROSS/BLUE SHIELD | Admitting: Certified Nurse Midwife

## 2014-11-11 ENCOUNTER — Encounter: Payer: Self-pay | Admitting: Certified Nurse Midwife

## 2014-11-11 VITALS — BP 122/80 | HR 68 | Resp 16 | Ht 61.75 in | Wt 173.0 lb

## 2014-11-11 DIAGNOSIS — N952 Postmenopausal atrophic vaginitis: Secondary | ICD-10-CM

## 2014-11-11 DIAGNOSIS — Z124 Encounter for screening for malignant neoplasm of cervix: Secondary | ICD-10-CM

## 2014-11-11 DIAGNOSIS — Z01419 Encounter for gynecological examination (general) (routine) without abnormal findings: Secondary | ICD-10-CM | POA: Diagnosis not present

## 2014-11-11 MED ORDER — ESTRADIOL 10 MCG VA TABS: 1.0000 | ORAL_TABLET | Freq: Three times a day (TID) | VAGINAL | Status: DC

## 2014-11-11 NOTE — Progress Notes (Signed)
Encounter reviewed Jill Jertson, MD   

## 2014-11-11 NOTE — Patient Instructions (Signed)
General topics  Next pap or exam is  due in 1 year Take a Women's multivitamin Take 1200 mg. of calcium daily - prefer dietary If any concerns in interim to call back  Breast Self-Awareness Practicing breast self-awareness may pick up problems early, prevent significant medical complications, and possibly save your life. By practicing breast self-awareness, you can become familiar with how your breasts look and feel and if your breasts are changing. This allows you to notice changes early. It can also offer you some reassurance that your breast health is good. One way to learn what is normal for your breasts and whether your breasts are changing is to do a breast self-exam. If you find a lump or something that was not present in the past, it is best to contact your caregiver right away. Other findings that should be evaluated by your caregiver include nipple discharge, especially if it is bloody; skin changes or reddening; areas where the skin seems to be pulled in (retracted); or new lumps and bumps. Breast pain is seldom associated with cancer (malignancy), but should also be evaluated by a caregiver. BREAST SELF-EXAM The best time to examine your breasts is 5 7 days after your menstrual period is over.  ExitCare Patient Information 2013 [employer].   Exercise to Stay Healthy Exercise helps you become and stay healthy. EXERCISE IDEAS AND TIPS Choose exercises that:  You enjoy.  Fit into your day. You do not need to exercise really hard to be healthy. You can do exercises at a slow or medium level and stay healthy. You can:  Stretch before and after working out.  Try yoga, Pilates, or tai chi.  Lift weights.  Walk fast, swim, jog, run, climb stairs, bicycle, dance, or rollerskate.  Take aerobic classes. Exercises that burn about 150 calories:  Running 1  miles in 15 minutes.  Playing volleyball for 45 to 60 minutes.  Washing and waxing a car for 45 to 60  minutes.  Playing touch football for 45 minutes.  Walking 1  miles in 35 minutes.  Pushing a stroller 1  miles in 30 minutes.  Playing basketball for 30 minutes.  Raking leaves for 30 minutes.  Bicycling 5 miles in 30 minutes.  Walking 2 miles in 30 minutes.  Dancing for 30 minutes.  Shoveling snow for 15 minutes.  Swimming laps for 20 minutes.  Walking up stairs for 15 minutes.  Bicycling 4 miles in 15 minutes.  Gardening for 30 to 45 minutes.  Jumping rope for 15 minutes.  Washing windows or floors for 45 to 60 minutes. Document Released: 02/26/2010 Document Revised: 04/18/2011 Document Reviewed: 02/26/2010 ExitCare Patient Information 2013 [employer].   Other topics ( that may be useful information):    Sexually Transmitted Disease Sexually transmitted disease (STD) refers to any infection that is passed from person to person during sexual activity. This may happen by way of saliva, semen, blood, vaginal mucus, or urine. Common STDs include:  Gonorrhea.  Chlamydia.  Syphilis.  HIV/AIDS.  Genital herpes.  Hepatitis B and C.  Trichomonas.  Human papillomavirus (HPV).  Pubic lice. CAUSES  An STD may be spread by bacteria, virus, or parasite. A person can get an STD by:  Sexual intercourse with an infected person.  Sharing sex toys with an infected person.  Sharing needles with an infected person.  Having intimate contact with the genitals, mouth, or rectal areas of an infected person. SYMPTOMS  Some people may not have any symptoms, but   they can still pass the infection to others. Different STDs have different symptoms. Symptoms include:  Painful or bloody urination.  Pain in the pelvis, abdomen, vagina, anus, throat, or eyes.  Skin rash, itching, irritation, growths, or sores (lesions). These usually occur in the genital or anal area.  Abnormal vaginal discharge.  Penile discharge in men.  Soft, flesh-colored skin growths in the  genital or anal area.  Fever.  Pain or bleeding during sexual intercourse.  Swollen glands in the groin area.  Yellow skin and eyes (jaundice). This is seen with hepatitis. DIAGNOSIS  To make a diagnosis, your caregiver may:  Take a medical history.  Perform a physical exam.  Take a specimen (culture) to be examined.  Examine a sample of discharge under a microscope.  Perform blood test TREATMENT   Chlamydia, gonorrhea, trichomonas, and syphilis can be cured with antibiotic medicine.  Genital herpes, hepatitis, and HIV can be treated, but not cured, with prescribed medicines. The medicines will lessen the symptoms.  Genital warts from HPV can be treated with medicine or by freezing, burning (electrocautery), or surgery. Warts may come back.  HPV is a virus and cannot be cured with medicine or surgery.However, abnormal areas may be followed very closely by your caregiver and may be removed from the cervix, vagina, or vulva through office procedures or surgery. If your diagnosis is confirmed, your recent sexual partners need treatment. This is true even if they are symptom-free or have a negative culture or evaluation. They should not have sex until their caregiver says it is okay. HOME CARE INSTRUCTIONS  All sexual partners should be informed, tested, and treated for all STDs.  Take your antibiotics as directed. Finish them even if you start to feel better.  Only take over-the-counter or prescription medicines for pain, discomfort, or fever as directed by your caregiver.  Rest.  Eat a balanced diet and drink enough fluids to keep your urine clear or pale yellow.  Do not have sex until treatment is completed and you have followed up with your caregiver. STDs should be checked after treatment.  Keep all follow-up appointments, Pap tests, and blood tests as directed by your caregiver.  Only use latex condoms and water-soluble lubricants during sexual activity. Do not use  petroleum jelly or oils.  Avoid alcohol and illegal drugs.  Get vaccinated for HPV and hepatitis. If you have not received these vaccines in the past, talk to your caregiver about whether one or both might be right for you.  Avoid risky sex practices that can break the skin. The only way to avoid getting an STD is to avoid all sexual activity.Latex condoms and dental dams (for oral sex) will help lessen the risk of getting an STD, but will not completely eliminate the risk. SEEK MEDICAL CARE IF:   You have a fever.  You have any new or worsening symptoms. Document Released: 04/16/2002 Document Revised: 04/18/2011 Document Reviewed: 04/23/2010 [facility] Patient Information 2013 [employer].    Domestic Abuse You are being battered or abused if someone close to you hits, pushes, or physically hurts you in any way. You also are being abused if you are forced into activities. You are being sexually abused if you are forced to have sexual contact of any kind. You are being emotionally abused if you are made to feel worthless or if you are constantly threatened. It is important to remember that help is available. No one has the right to abuse you. PREVENTION OF FURTHER  ABUSE  Learn the warning signs of danger. This varies with situations but may include: the use of alcohol, threats, isolation from friends and family, or forced sexual contact. Leave if you feel that violence is going to occur.  If you are attacked or beaten, report it to the police so the abuse is documented. You do not have to press charges. The police can protect you while you or the attackers are leaving. Get the officer's name and badge number and a copy of the report.  Find someone you can trust and tell them what is happening to you: your caregiver, a nurse, clergy member, close friend or family member. Feeling ashamed is natural, but remember that you have done nothing wrong. No one deserves abuse. Document Released:  01/22/2000 Document Revised: 04/18/2011 Document Reviewed: 04/01/2010 ExitCare Patient Information 2013 [employer].    How Much is Too Much Alcohol? Drinking too much alcohol can cause injury, accidents, and health problems. These types of problems can include:   Car crashes.  Falls.  Family fighting (domestic violence).  Drowning.  Fights.  Injuries.  Burns.  Damage to certain organs.  Having a baby with birth defects. ONE DRINK CAN BE TOO MUCH WHEN YOU ARE:  Working.  Pregnant or breastfeeding.  Taking medicines. Ask your doctor.  Driving or planning to drive. If you or someone you know has a drinking problem, get help from a doctor.  Document Released: 11/20/2008 Document Revised: 04/18/2011 Document Reviewed: 11/20/2008 ExitCare Patient Information 2013 [employer].   Smoking Hazards Smoking cigarettes is extremely bad for your health. Tobacco smoke has over 200 known poisons in it. There are over 60 chemicals in tobacco smoke that cause cancer. Some of the chemicals found in cigarette smoke include:   Cyanide.  Benzene.  Formaldehyde.  Methanol (wood alcohol).  Acetylene (fuel used in welding torches).  Ammonia. Cigarette smoke also contains the poisonous gases nitrogen oxide and carbon monoxide.  Cigarette smokers have an increased risk of many serious medical problems and Smoking causes approximately:  90% of all lung cancer deaths in men.  80% of all lung cancer deaths in women.  90% of deaths from chronic obstructive lung disease. Compared with nonsmokers, smoking increases the risk of:  Coronary heart disease by 2 to 4 times.  Stroke by 2 to 4 times.  Men developing lung cancer by 23 times.  Women developing lung cancer by 13 times.  Dying from chronic obstructive lung diseases by 12 times.  . Smoking is the most preventable cause of death and disease in our society.  WHY IS SMOKING ADDICTIVE?  Nicotine is the chemical  agent in tobacco that is capable of causing addiction or dependence.  When you smoke and inhale, nicotine is absorbed rapidly into the bloodstream through your lungs. Nicotine absorbed through the lungs is capable of creating a powerful addiction. Both inhaled and non-inhaled nicotine may be addictive.  Addiction studies of cigarettes and spit tobacco show that addiction to nicotine occurs mainly during the teen years, when young people begin using tobacco products. WHAT ARE THE BENEFITS OF QUITTING?  There are many health benefits to quitting smoking.   Likelihood of developing cancer and heart disease decreases. Health improvements are seen almost immediately.  Blood pressure, pulse rate, and breathing patterns start returning to normal soon after quitting. QUITTING SMOKING   American Lung Association - 1-800-LUNGUSA  American Cancer Society - 1-800-ACS-2345 Document Released: 03/03/2004 Document Revised: 04/18/2011 Document Reviewed: 11/05/2008 ExitCare Patient Information 2013 [employer]   LL[employer].   Stress Management Stress is a state of physical or mental tension that often results from changes in your life or normal routine. Some common causes of stress are:  Death of a loved one.  Injuries or severe illnesses.  Getting fired or changing jobs.  Moving into a new home. Other causes may be:  Sexual problems.  Business or financial losses.  Taking on a large debt.  Regular conflict with someone at home or at work.  Constant tiredness from lack of sleep. It is not just bad things that are stressful. It may be stressful to:  Win the lottery.  Get married.  Buy a new car. The amount of stress that can be easily tolerated varies from person to person. Changes generally cause stress, regardless of the types of change. Too much stress can affect your health. It may lead to physical or emotional problems. Too little stress (boredom) may also become stressful. SUGGESTIONS TO  REDUCE STRESS:  Talk things over with your family and friends. It often is helpful to share your concerns and worries. If you feel your problem is serious, you may want to get help from a professional counselor.  Consider your problems one at a time instead of lumping them all together. Trying to take care of everything at once may seem impossible. List all the things you need to do and then start with the most important one. Set a goal to accomplish 2 or 3 things each day. If you expect to do too many in a single day you will naturally fail, causing you to feel even more stressed.  Do not use alcohol or drugs to relieve stress. Although you may feel better for a short time, they do not remove the problems that caused the stress. They can also be habit forming.  Exercise regularly - at least 3 times per week. Physical exercise can help to relieve that "uptight" feeling and will relax you.  The shortest distance between despair and hope is often a good night's sleep.  Go to bed and get up on time allowing yourself time for appointments without being rushed.  Take a short "time-out" period from any stressful situation that occurs during the day. Close your eyes and take some deep breaths. Starting with the muscles in your face, tense them, hold it for a few seconds, then relax. Repeat this with the muscles in your neck, shoulders, hand, stomach, back and legs.  Take good care of yourself. Eat a balanced diet and get plenty of rest.  Schedule time for having fun. Take a break from your daily routine to relax. HOME CARE INSTRUCTIONS   Call if you feel overwhelmed by your problems and feel you can no longer manage them on your own.  Return immediately if you feel like hurting yourself or someone else. Document Released: 07/20/2000 Document Revised: 04/18/2011 Document Reviewed: 03/12/2007 ExitCare Patient Information 2013 [employer].   

## 2014-11-11 NOTE — Progress Notes (Signed)
54 y.o. [id number] Married  Caucasian Fe here for annual exam. Menopausal no HRT. Using Vagifem for atrophic vaginitis, working well better than a year ago. Denies any vaginal bleeding. Sees PCP yearly for aex/labs and cholesterol management. 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 Patient has right shoulder surgery scheduled for rotator cuff and biceps repair. Sees PCP in 11/16. No other health issues today.  Patient's last menstrual period was 07/09/2010.          Sexually active: Yes.    The current method of family planning is vasectomy.    Exercising: Yes.    occ Smoker:  no  Health Maintenance: Pap:  10-29-12 neg HPV HR neg MMG: 09-29-14 category b density,birads 1:neg Colonoscopy:  11/14 polyp 5 years BMD:   none TDaP:  2013 Labs: pcp Self breast exam: done occ   reports that she has quit smoking. She does not have any smokeless tobacco history on file. She reports that she does not drink alcohol or use illicit drugs.  Past Medical History  Diagnosis Date  . Dyspareunia     dryness  . Concussion     09-30-13  . Closed angle glaucoma     Past Surgical History  Procedure Laterality Date  . 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 Other surgical history      adenoids only age 32  . Iridotomy / iridectomy      laser    Current Outpatient Prescriptions  Medication Sig Dispense Refill  . atorvastatin (LIPITOR) 10 MG tablet Take 10 mg by mouth daily.  11  . B Complex Vitamins (B COMPLEX PO) Take by mouth. Takes 2 daily    . desonide (DESOWEN) 0.05 % cream 2 (two) times daily as needed.    . Estradiol (VAGIFEM) 10 MCG TABS vaginal tablet Place 1 tablet (10 mcg total) vaginally 3 (three) times a week. (Patient taking differently: Place 1 tablet vaginally 2 (two) times a week. ) 15 tablet 4  . ibuprofen (ADVIL,MOTRIN) 400 MG tablet Take 400 mg by mouth every 6 (six) hours as needed.    . methocarbamol (ROBAXIN) 500 MG  tablet TAKE 1 TABLET BY MOUTH EVERY 6 HOURS AS NEEDED FOR SPASMS  0  . Multiple Vitamins-Minerals (MULTIVITAMIN PO) Take by mouth daily.    . polyvinyl alcohol-povidone (REFRESH) 1.4-0.6 % ophthalmic solution Place 1 drop into both eyes every morning.    . Probiotic Product (PROBIOTIC PO) Take by mouth daily.     No current facility-administered medications for this visit.    Family History  Problem Relation Age of Onset  . Hypertension Mother   . Diabetes Father   . Hypertension Father   . Heart disease Father   . Diabetes Maternal Uncle   . Hypertension Maternal Grandmother   . Stroke Maternal Grandmother   . Breast cancer Paternal Grandmother   . Hypertension Paternal Grandmother   . Thyroid disease Paternal Grandmother     ROS:  Pertinent items are noted in HPI.  Otherwise, a comprehensive ROS was negative.  Exam:   BP 122/80 mmHg  Pulse 68  Resp 16  Ht 5' 1.75" (1.568 m)  Wt 173 lb (78.472 kg)  BMI 31.92 kg/m2  LMP 07/09/2010 Height: 5' 1.75" (156.8 cm) Ht Readings from Last 3 Encounters:  11/11/14 5' 1.75" (1.568 m)  07/24/14 5' 1.75" (1.568 m)  06/30/14 5' 1.75" (1.568 m)    General appearance: alert, cooperative  and appears stated age Head: Normocephalic, without obvious abnormality, atraumatic Neck: no adenopathy, supple, symmetrical, trachea midline and thyroid normal to inspection and palpation Lungs: clear to auscultation bilaterally Breasts: normal appearance, no masses or tenderness, No nipple retraction or dimpling, No nipple discharge or bleeding, No axillary or supraclavicular adenopathy Heart: regular rate and rhythm Abdomen: soft, non-tender; no masses,  no organomegaly Extremities: extremities normal, atraumatic, no cyanosis or edema Skin: Skin color, texture, turgor normal. No rashes or lesions Lymph nodes: Cervical, supraclavicular, and axillary nodes normal. No abnormal inguinal nodes palpated Neurologic: Grossly normal   Pelvic: External  genitalia:  no lesions              Urethra:  normal appearing urethra with no masses, tenderness or lesions              Bartholin's and Skene's: normal                 Vagina: normal appearing vagina with normal color and discharge, no lesions              Cervix: normal ,non tender, no lesions              Pap taken: Yes.   Bimanual Exam:  Uterus:  normal size, contour, position, consistency, mobility, non-tender              Adnexa: normal adnexa and no mass, fullness, tenderness               Rectovaginal: Confirms               Anus:  normal sphincter tone, no lesions  Chaperone present: yes  A:  Well Woman with normal exam  Menopausal no HRT  Atrophic vaginitis Vagifem working well, desires continuance  Closed angle glaucoma both eyes with treatment in process with MD.  Cholesterol management with PCP  Upcoming surgery for right rotator cuff repair  P:   Reviewed health and wellness pertinent to exam  Rx Vagifem see order, 3 times weekly only if has flare of dryness, will advise if needs to use 3 times weekly, may need Provera. Patient aware has done this before.  Continue follow up as indicated per MD  Pap smear as above with HPV reflex Wished well with surgery.  counseled on breast self exam, mammography screening,calcium and Vit D importance, diet and exercise  return annually or prn  An After Visit Summary was printed and given to the patient.

## 2014-11-12 LAB — IPS PAP TEST WITH REFLEX TO HPV

## 2014-12-09 HISTORY — PX: SHOULDER SURGERY: SHX246

## 2015-05-15 DIAGNOSIS — M25511 Pain in right shoulder: Secondary | ICD-10-CM | POA: Diagnosis not present

## 2015-05-29 DIAGNOSIS — M25511 Pain in right shoulder: Secondary | ICD-10-CM | POA: Diagnosis not present

## 2015-06-16 DIAGNOSIS — M75111 Incomplete rotator cuff tear or rupture of right shoulder, not specified as traumatic: Secondary | ICD-10-CM | POA: Diagnosis not present

## 2015-06-16 DIAGNOSIS — Z4789 Encounter for other orthopedic aftercare: Secondary | ICD-10-CM | POA: Diagnosis not present

## 2015-06-18 DIAGNOSIS — H5203 Hypermetropia, bilateral: Secondary | ICD-10-CM | POA: Diagnosis not present

## 2015-06-18 DIAGNOSIS — H524 Presbyopia: Secondary | ICD-10-CM | POA: Diagnosis not present

## 2015-06-18 DIAGNOSIS — H5310 Unspecified subjective visual disturbances: Secondary | ICD-10-CM | POA: Diagnosis not present

## 2015-06-18 DIAGNOSIS — H40243 Residual stage of angle-closure glaucoma, bilateral: Secondary | ICD-10-CM | POA: Diagnosis not present

## 2015-09-02 ENCOUNTER — Other Ambulatory Visit: Payer: Self-pay | Admitting: Internal Medicine

## 2015-09-02 DIAGNOSIS — Z1231 Encounter for screening mammogram for malignant neoplasm of breast: Secondary | ICD-10-CM

## 2015-09-21 DIAGNOSIS — M25511 Pain in right shoulder: Secondary | ICD-10-CM | POA: Diagnosis not present

## 2015-09-21 DIAGNOSIS — R030 Elevated blood-pressure reading, without diagnosis of hypertension: Secondary | ICD-10-CM | POA: Diagnosis not present

## 2015-09-21 DIAGNOSIS — R51 Headache: Secondary | ICD-10-CM | POA: Diagnosis not present

## 2015-09-21 DIAGNOSIS — R202 Paresthesia of skin: Secondary | ICD-10-CM | POA: Diagnosis not present

## 2015-09-21 DIAGNOSIS — R5383 Other fatigue: Secondary | ICD-10-CM | POA: Diagnosis not present

## 2015-09-21 DIAGNOSIS — M5030 Other cervical disc degeneration, unspecified cervical region: Secondary | ICD-10-CM | POA: Diagnosis not present

## 2015-10-01 ENCOUNTER — Ambulatory Visit
Admission: RE | Admit: 2015-10-01 | Discharge: 2015-10-01 | Disposition: A | Discharge status: Home / Self Care | Payer: BLUE CROSS/BLUE SHIELD | Source: Ambulatory Visit | Attending: Internal Medicine | Admitting: Internal Medicine

## 2015-10-01 DIAGNOSIS — Z1231 Encounter for screening mammogram for malignant neoplasm of breast: Secondary | ICD-10-CM

## 2015-11-07 DIAGNOSIS — Z23 Encounter for immunization: Secondary | ICD-10-CM | POA: Diagnosis not present

## 2015-11-12 ENCOUNTER — Ambulatory Visit: Payer: BLUE CROSS/BLUE SHIELD | Admitting: Certified Nurse Midwife

## 2015-11-12 DIAGNOSIS — M545 Low back pain: Secondary | ICD-10-CM | POA: Diagnosis not present

## 2015-11-26 ENCOUNTER — Ambulatory Visit (INDEPENDENT_AMBULATORY_CARE_PROVIDER_SITE_OTHER): Payer: BLUE CROSS/BLUE SHIELD | Admitting: Certified Nurse Midwife

## 2015-11-26 ENCOUNTER — Encounter: Payer: Self-pay | Admitting: Certified Nurse Midwife

## 2015-11-26 VITALS — BP 110/72 | HR 70 | Resp 16 | Ht 61.75 in | Wt 168.0 lb

## 2015-11-26 DIAGNOSIS — N952 Postmenopausal atrophic vaginitis: Secondary | ICD-10-CM

## 2015-11-26 DIAGNOSIS — Z124 Encounter for screening for malignant neoplasm of cervix: Secondary | ICD-10-CM

## 2015-11-26 DIAGNOSIS — Z01419 Encounter for gynecological examination (general) (routine) without abnormal findings: Secondary | ICD-10-CM | POA: Diagnosis not present

## 2015-11-26 MED ORDER — ESTRADIOL 10 MCG VA TABS: 1.0000 | ORAL_TABLET | VAGINAL | 11 refills | Status: DC

## 2015-11-26 NOTE — Progress Notes (Signed)
55 y.o. G1P001L0 Married  Caucasian Fe here for annual exam. Menopausal no HRT. Denies vaginal bleeding. Using Vagifem for atrophic with good results. Had right rotator cuff repair in 11/16 and now out of  PT. Feeling much better. Sees Dr. Waynard Edwards for aex and labs and Lipitor. management. Mother diagnosed with ductal breast cancer age 17 this year, doing well. No other health issues today.  Patient's last menstrual period was 07/09/2010.          Sexually active: Yes.    The current method of family planning is vasectomy.    Exercising: No.  exercise Smoker:  no  Health Maintenance: Pap:  11-11-14 neg MMG:  10-01-15 category b density birads 1:neg Colonoscopy:  11/14 polyps f/u 25yrs due 11/19 BMD:   none TDaP:  2013 Shingles: no Pneumonia: no Hep C and HIV: plans at PCP visit Labs: pcp Self breast exam: done monthly   reports that she has quit smoking. She has never used smokeless tobacco. She reports that she does not drink alcohol or use drugs.  Past Medical History:  Diagnosis Date  . Closed angle glaucoma   . Concussion    09-30-13  . Dyspareunia    dryness    Past Surgical History:  Procedure Laterality Date  . IRIDOTOMY / IRIDECTOMY     laser both eyes  . OTHER SURGICAL HISTORY     adenoids only age 69  . SHOULDER SURGERY Right 12/2014  . 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 atorvastatin (LIPITOR) 10 MG tablet Take 10 mg by mouth daily.  11  . B Complex Vitamins (B COMPLEX PO) Take by mouth. Takes 2 daily    . desonide (DESOWEN) 0.05 % cream 2 (two) times daily as needed.    . Estradiol (VAGIFEM) 10 MCG TABS vaginal tablet Place 1 tablet (10 mcg total) vaginally 3 (three) times daily. Use every night before bed for two weeks when you first begin this medicine, then after the first two weeks, begin using it twice a week. (Patient taking differently: Place 1 tablet vaginally 2 (two) times a week. )  18 tablet 12  . ibuprofen (ADVIL,MOTRIN) 400 MG tablet Take 400 mg by mouth every 6 (six) hours as needed.    . methocarbamol (ROBAXIN) 500 MG tablet TAKE 1 TABLET BY MOUTH EVERY 6 HOURS AS NEEDED FOR SPASMS  0  . Multiple Vitamins-Minerals (MULTIVITAMIN PO) Take by mouth daily.    . polyvinyl alcohol-povidone (REFRESH) 1.4-0.6 % ophthalmic solution Place 1 drop into both eyes every morning.    . Probiotic Product (PROBIOTIC PO) Take by mouth daily.     No current facility-administered medications for this visit.     Family History  Problem Relation Age of Onset  . Hypertension Mother   . Breast cancer Mother   . Diabetes Father   . Hypertension Father   . Heart disease Father   . Diabetes Maternal Uncle   . Hypertension Maternal Grandmother   . Stroke Maternal Grandmother   . Breast cancer Paternal Grandmother   . Hypertension Paternal Grandmother   . Thyroid disease Paternal Grandmother   . Other Sister     RA    ROS:  Pertinent items are noted in HPI.  Otherwise, a comprehensive ROS was negative.  Exam:   BP 110/72   Pulse 70   Resp 16   Ht 5' 1.75" (1.568 m)   Wt  168 lb (76.2 kg)   LMP 07/09/2010   BMI 30.98 kg/m  Height: 5' 1.75" (156.8 cm) Ht Readings from Last 3 Encounters:  11/26/15 5' 1.75" (1.568 m)  11/11/14 5' 1.75" (1.568 m)  07/24/14 5' 1.75" (1.568 m)    General appearance: alert, cooperative and appears stated age Head: Normocephalic, without obvious abnormality, atraumatic Neck: no adenopathy, supple, symmetrical, trachea midline and thyroid normal to inspection and palpation Lungs: clear to auscultation bilaterally Breasts: normal appearance, no masses or tenderness, No nipple retraction or dimpling, No nipple discharge or bleeding, No axillary or supraclavicular adenopathy Heart: regular rate and rhythm Abdomen: soft, non-tender; no masses,  no organomegaly Extremities: extremities normal, atraumatic, no cyanosis or edema Skin: Skin color,  texture, turgor normal. No rashes or lesions Lymph nodes: Cervical, supraclavicular, and axillary nodes normal. No abnormal inguinal nodes palpated Neurologic: Grossly normal   Pelvic: External genitalia:  no lesions              Urethra:  normal appearing urethra with no masses, tenderness or lesions              Bartholin's and Skene's: normal                 Vagina: normal appearing vagina with normal color and discharge, no lesions              Cervix: no cervical motion tenderness, no lesions and normal appearance              Pap taken: Yes.   Bimanual Exam:  Uterus:  normal size, contour, position, consistency, mobility, non-tender              Adnexa: normal adnexa and no mass, fullness, tenderness               Rectovaginal: Confirms               Anus:  normal sphincter tone, no lesions  Chaperone present: yes  A:  Well Woman with normal exam  Menopausal no HRT  Atrophic vaginitis with Vagifem use working well  Cholesterol management with PCP  Recent right shoulder rotator repair no issues now  Family history of breast cancer mother [id number]( this year)   P:   Reviewed health and wellness pertinent to exam  Aware of need to evaluate if vaginal bleeding  Reviewed risks and benefits of HRT and expectations. Patient desires continuance.  Rx Vagifem(generic) twice weekly see order  Stressed mammogram use and SBE. Patient will check to see if genetic screening done.  Pap smear as above with HPV reflex   counseled on breast self exam, mammography screening, menopause, adequate intake of calcium and vitamin D, diet and exercise  return annually or prn  An After Visit Summary was printed and given to the patient.

## 2015-11-26 NOTE — Patient Instructions (Signed)

## 2015-11-27 LAB — IPS PAP TEST WITH REFLEX TO HPV

## 2015-12-04 NOTE — Progress Notes (Signed)
Encounter reviewed Elmon Shader, MD   

## 2016-01-19 DIAGNOSIS — Z0000 Encounter for general adult medical examination without abnormal findings: Secondary | ICD-10-CM | POA: Diagnosis not present

## 2016-01-19 DIAGNOSIS — N390 Urinary tract infection, site not specified: Secondary | ICD-10-CM | POA: Diagnosis not present

## 2016-01-22 ENCOUNTER — Other Ambulatory Visit: Payer: Self-pay | Admitting: Certified Nurse Midwife

## 2016-01-22 DIAGNOSIS — N952 Postmenopausal atrophic vaginitis: Secondary | ICD-10-CM

## 2016-01-26 DIAGNOSIS — R194 Change in bowel habit: Secondary | ICD-10-CM | POA: Diagnosis not present

## 2016-01-26 DIAGNOSIS — Z8249 Family history of ischemic heart disease and other diseases of the circulatory system: Secondary | ICD-10-CM | POA: Diagnosis not present

## 2016-01-26 DIAGNOSIS — Z6830 Body mass index (BMI) 30.0-30.9, adult: Secondary | ICD-10-CM | POA: Diagnosis not present

## 2016-01-26 DIAGNOSIS — Z0000 Encounter for general adult medical examination without abnormal findings: Secondary | ICD-10-CM | POA: Diagnosis not present

## 2016-01-26 DIAGNOSIS — N951 Menopausal and female climacteric states: Secondary | ICD-10-CM | POA: Diagnosis not present

## 2016-01-26 DIAGNOSIS — Z1389 Encounter for screening for other disorder: Secondary | ICD-10-CM | POA: Diagnosis not present

## 2016-01-26 DIAGNOSIS — Z23 Encounter for immunization: Secondary | ICD-10-CM | POA: Diagnosis not present

## 2016-01-26 DIAGNOSIS — E784 Other hyperlipidemia: Secondary | ICD-10-CM | POA: Diagnosis not present

## 2016-02-03 DIAGNOSIS — Z1212 Encounter for screening for malignant neoplasm of rectum: Secondary | ICD-10-CM | POA: Diagnosis not present

## 2016-06-20 DIAGNOSIS — H5203 Hypermetropia, bilateral: Secondary | ICD-10-CM | POA: Diagnosis not present

## 2016-06-20 DIAGNOSIS — H532 Diplopia: Secondary | ICD-10-CM | POA: Diagnosis not present

## 2016-06-20 DIAGNOSIS — H40033 Anatomical narrow angle, bilateral: Secondary | ICD-10-CM | POA: Diagnosis not present

## 2016-06-20 DIAGNOSIS — H2513 Age-related nuclear cataract, bilateral: Secondary | ICD-10-CM | POA: Diagnosis not present

## 2016-08-15 ENCOUNTER — Other Ambulatory Visit: Payer: Self-pay | Admitting: Internal Medicine

## 2016-08-15 DIAGNOSIS — K5730 Diverticulosis of large intestine without perforation or abscess without bleeding: Secondary | ICD-10-CM | POA: Diagnosis not present

## 2016-08-15 DIAGNOSIS — R198 Other specified symptoms and signs involving the digestive system and abdomen: Secondary | ICD-10-CM

## 2016-08-15 DIAGNOSIS — K219 Gastro-esophageal reflux disease without esophagitis: Secondary | ICD-10-CM | POA: Diagnosis not present

## 2016-08-15 DIAGNOSIS — R1031 Right lower quadrant pain: Secondary | ICD-10-CM

## 2016-08-15 DIAGNOSIS — K5790 Diverticulosis of intestine, part unspecified, without perforation or abscess without bleeding: Secondary | ICD-10-CM

## 2016-08-15 DIAGNOSIS — R194 Change in bowel habit: Secondary | ICD-10-CM | POA: Diagnosis not present

## 2016-08-16 ENCOUNTER — Ambulatory Visit
Admission: RE | Admit: 2016-08-16 | Discharge: 2016-08-16 | Disposition: A | Discharge status: Home / Self Care | Payer: BLUE CROSS/BLUE SHIELD | Source: Ambulatory Visit | Attending: Internal Medicine | Admitting: Internal Medicine

## 2016-08-16 DIAGNOSIS — R198 Other specified symptoms and signs involving the digestive system and abdomen: Secondary | ICD-10-CM

## 2016-08-16 DIAGNOSIS — R1031 Right lower quadrant pain: Secondary | ICD-10-CM | POA: Diagnosis not present

## 2016-08-16 DIAGNOSIS — K5790 Diverticulosis of intestine, part unspecified, without perforation or abscess without bleeding: Secondary | ICD-10-CM

## 2016-08-16 MED ORDER — IOPAMIDOL (ISOVUE-300) INJECTION 61%
100.0000 mL | Freq: Once | INTRAVENOUS | Status: AC | PRN
  Administered 2016-08-16: 100 mL via INTRAVENOUS

## 2016-08-18 ENCOUNTER — Other Ambulatory Visit: Payer: Self-pay | Admitting: Internal Medicine

## 2016-08-18 DIAGNOSIS — Z1231 Encounter for screening mammogram for malignant neoplasm of breast: Secondary | ICD-10-CM

## 2016-08-25 DIAGNOSIS — R195 Other fecal abnormalities: Secondary | ICD-10-CM | POA: Diagnosis not present

## 2016-08-25 DIAGNOSIS — R05 Cough: Secondary | ICD-10-CM | POA: Diagnosis not present

## 2016-09-05 DIAGNOSIS — K5904 Chronic idiopathic constipation: Secondary | ICD-10-CM | POA: Diagnosis not present

## 2016-09-05 DIAGNOSIS — R195 Other fecal abnormalities: Secondary | ICD-10-CM | POA: Diagnosis not present

## 2016-09-05 DIAGNOSIS — Z8371 Family history of colonic polyps: Secondary | ICD-10-CM | POA: Diagnosis not present

## 2016-09-05 DIAGNOSIS — R1031 Right lower quadrant pain: Secondary | ICD-10-CM | POA: Diagnosis not present

## 2016-10-03 ENCOUNTER — Ambulatory Visit
Admission: RE | Admit: 2016-10-03 | Discharge: 2016-10-03 | Disposition: A | Discharge status: Home / Self Care | Payer: BLUE CROSS/BLUE SHIELD | Source: Ambulatory Visit | Attending: Internal Medicine | Admitting: Internal Medicine

## 2016-10-03 DIAGNOSIS — Z1231 Encounter for screening mammogram for malignant neoplasm of breast: Secondary | ICD-10-CM

## 2016-10-24 DIAGNOSIS — R195 Other fecal abnormalities: Secondary | ICD-10-CM | POA: Diagnosis not present

## 2016-10-24 DIAGNOSIS — R1031 Right lower quadrant pain: Secondary | ICD-10-CM | POA: Diagnosis not present

## 2016-11-05 DIAGNOSIS — Z23 Encounter for immunization: Secondary | ICD-10-CM | POA: Diagnosis not present

## 2016-11-29 ENCOUNTER — Ambulatory Visit (INDEPENDENT_AMBULATORY_CARE_PROVIDER_SITE_OTHER): Payer: BLUE CROSS/BLUE SHIELD | Admitting: Certified Nurse Midwife

## 2016-11-29 ENCOUNTER — Encounter: Payer: Self-pay | Admitting: Certified Nurse Midwife

## 2016-11-29 ENCOUNTER — Other Ambulatory Visit (HOSPITAL_COMMUNITY)
Admission: RE | Admit: 2016-11-29 | Discharge: 2016-11-29 | Disposition: A | Discharge status: Home / Self Care | Payer: BLUE CROSS/BLUE SHIELD | Source: Ambulatory Visit | Attending: Certified Nurse Midwife | Admitting: Certified Nurse Midwife

## 2016-11-29 VITALS — BP 120/80 | HR 70 | Resp 16 | Ht 61.75 in | Wt 166.0 lb

## 2016-11-29 DIAGNOSIS — N952 Postmenopausal atrophic vaginitis: Secondary | ICD-10-CM

## 2016-11-29 DIAGNOSIS — Z01419 Encounter for gynecological examination (general) (routine) without abnormal findings: Secondary | ICD-10-CM | POA: Insufficient documentation

## 2016-11-29 DIAGNOSIS — Z124 Encounter for screening for malignant neoplasm of cervix: Secondary | ICD-10-CM

## 2016-11-29 MED ORDER — ESTRADIOL 10 MCG VA TABS: 1.0000 | ORAL_TABLET | VAGINAL | 4 refills | Status: DC

## 2016-11-29 MED ORDER — ESTRADIOL 10 MCG VA TABS: 1.0000 | ORAL_TABLET | VAGINAL | 11 refills | Status: DC

## 2016-11-29 NOTE — Patient Instructions (Signed)

## 2016-11-29 NOTE — Progress Notes (Signed)
56 y.o. [id number] Married  Caucasian Fe here for annual exam. Menopausal no HRT. Denies vaginal bleeding or vaginal dryness. Has had abdominal CT scan this past summer, all  Normal. Had colonoscopy recently in 9/18 which was negative. Was treated for pneumonia in the summer, noted on chest x-ray.Continues to have vaginal dryness but having good results with Yufavem use. Sees PCP for aex, labs and cholesterol management. Recent death of her favorite dog. Still crying at times over the loss.. Her other dog is grieving too! No other health issues today.  Patient's last menstrual period was 07/09/2010.          Sexually active: Yes.    The current method of family planning is vasectomy.    Exercising: No.  exercise Smoker:  no  Health Maintenance: Pap:  11-11-14 neg, 11-26-15 neg History of Abnormal Pap: no MMG:  10-03-16 category b density birads 1:neg Self Breast exams: yes Colonoscopy: 10-24-16 f/u [age]yrs BMD:   none TDaP:  2013 Shingles: no Pneumonia: 12/17 Hep C and HIV: Hep c neg 2017 Labs: none   reports that she has quit smoking. She has never used smokeless tobacco. She reports that she does not drink alcohol or use drugs.  Past Medical History:  Diagnosis Date  . Closed angle glaucoma   . Concussion    09-30-13  . Dyspareunia    dryness    Past Surgical History:  Procedure Laterality Date  . IRIDOTOMY / IRIDECTOMY     laser both eyes  . OTHER SURGICAL HISTORY     adenoids only age 89  . SHOULDER SURGERY Right 12/2014  . 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 atorvastatin (LIPITOR) 10 MG tablet Take 10 mg by mouth daily.  11  . B Complex Vitamins (B COMPLEX PO) Take by mouth. Takes 2 daily    . desonide (DESOWEN) 0.05 % cream 2 (two) times daily as needed.    . Estradiol 10 MCG TABS vaginal tablet Place 1 tablet (10 mcg total) vaginally 2 (two) times a week. 16 tablet 11  . ibuprofen (ADVIL,MOTRIN)  400 MG tablet Take 400 mg by mouth every 6 (six) hours as needed.    . methocarbamol (ROBAXIN) 500 MG tablet TAKE 1 TABLET BY MOUTH EVERY 6 HOURS AS NEEDED FOR SPASMS  0  . Multiple Vitamins-Minerals (MULTIVITAMIN PO) Take by mouth daily.    . polyvinyl alcohol-povidone (REFRESH) 1.4-0.6 % ophthalmic solution Place 1 drop into both eyes every morning.    . Probiotic Product (PROBIOTIC PO) Take by mouth daily.     No current facility-administered medications for this visit.     Family History  Problem Relation Age of Onset  . Hypertension Mother   . Breast cancer Mother   . Diabetes Father   . Hypertension Father   . Heart disease Father   . Diabetes Maternal Uncle   . Hypertension Maternal Grandmother   . Stroke Maternal Grandmother   . Breast cancer Paternal Grandmother   . Hypertension Paternal Grandmother   . Thyroid disease Paternal Grandmother   . Other Sister        RA    ROS:  Pertinent items are noted in HPI.  Otherwise, a comprehensive ROS was negative.  Exam:   LMP 07/09/2010    Ht Readings from Last 3 Encounters:  11/26/15 5' 1.75" (1.568 m)  11/11/14 5' 1.75" (1.568 m)  07/24/14 5' 1.75" (1.568  m)    General appearance: alert, cooperative and appears stated age Head: Normocephalic, without obvious abnormality, atraumatic Neck: no adenopathy, supple, symmetrical, trachea midline and thyroid normal to inspection and palpation Lungs: clear to auscultation bilaterally Breasts: normal appearance, no masses or tenderness, No nipple retraction or dimpling, No nipple discharge or bleeding, No axillary or supraclavicular adenopathy Heart: regular rate and rhythm Abdomen: soft, non-tender; no masses,  no organomegaly Extremities: extremities normal, atraumatic, no cyanosis or edema Skin: Skin color, texture, turgor normal. No rashes or lesions Lymph nodes: Cervical, supraclavicular, and axillary nodes normal. No abnormal inguinal nodes palpated Neurologic: Grossly  normal   Pelvic: External genitalia:  no lesions              Urethra:  normal appearing urethra with no masses, tenderness or lesions              Bartholin's and Skene's: normal                 Vagina: normal appearing vagina with normal color and discharge, no lesions              Cervix: no cervical motion tenderness, no lesions and normal appearance              Pap taken: Yes.   Bimanual Exam:  Uterus:  normal size, contour, position, consistency, mobility, non-tender              Adnexa: normal adnexa and no mass, fullness, tenderness               Rectovaginal: Confirms               Anus:  normal sphincter tone, no lesions  Chaperone present: yes  A:  Well Woman with normal exam  Menopausal with no HRT  Atrophic vaginitis good result with Yuvafem use, desires continuance  Cholesterol management with PCP  Social stress with her dog's death  Family  History of breast cancer mother and paternal grandmother  P:   Reviewed health and wellness pertinent to exam  Aware of need to evaluate if vaginal bleeding  Discussed risks and benefits and use consistently for good results. Discussed coconut oil use for sexual activity moisture. Questions addressed.  Rx Yuvafem see order with instructions  Continue follow up with PCP as indicated  Encouraged to focus on her other dog now so he will feel comforted  Stressed importance of annual mammogram and SBE.  Pap smear: yes  counseled on breast self exam, mammography screening, adequate intake of calcium and vitamin D, diet and exercise, Kegel's exercises  return annually or prn  An After Visit Summary was printed and given to the patient.

## 2016-12-01 DIAGNOSIS — K5901 Slow transit constipation: Secondary | ICD-10-CM | POA: Diagnosis not present

## 2016-12-01 DIAGNOSIS — R1031 Right lower quadrant pain: Secondary | ICD-10-CM | POA: Diagnosis not present

## 2016-12-01 DIAGNOSIS — R195 Other fecal abnormalities: Secondary | ICD-10-CM | POA: Diagnosis not present

## 2016-12-01 LAB — CYTOLOGY - PAP
Diagnosis: NEGATIVE
HPV: NOT DETECTED

## 2017-02-09 DIAGNOSIS — Z0000 Encounter for general adult medical examination without abnormal findings: Secondary | ICD-10-CM | POA: Diagnosis not present

## 2017-02-09 DIAGNOSIS — E7849 Other hyperlipidemia: Secondary | ICD-10-CM | POA: Diagnosis not present

## 2017-02-16 DIAGNOSIS — M545 Low back pain: Secondary | ICD-10-CM | POA: Diagnosis not present

## 2017-02-16 DIAGNOSIS — R351 Nocturia: Secondary | ICD-10-CM | POA: Diagnosis not present

## 2017-02-16 DIAGNOSIS — M79641 Pain in right hand: Secondary | ICD-10-CM | POA: Diagnosis not present

## 2017-02-16 DIAGNOSIS — K5730 Diverticulosis of large intestine without perforation or abscess without bleeding: Secondary | ICD-10-CM | POA: Diagnosis not present

## 2017-02-16 DIAGNOSIS — M79643 Pain in unspecified hand: Secondary | ICD-10-CM | POA: Diagnosis not present

## 2017-02-16 DIAGNOSIS — Z0000 Encounter for general adult medical examination without abnormal findings: Secondary | ICD-10-CM | POA: Diagnosis not present

## 2017-02-16 DIAGNOSIS — M79642 Pain in left hand: Secondary | ICD-10-CM | POA: Diagnosis not present

## 2017-02-16 DIAGNOSIS — Z1389 Encounter for screening for other disorder: Secondary | ICD-10-CM | POA: Diagnosis not present

## 2017-06-26 DIAGNOSIS — H2513 Age-related nuclear cataract, bilateral: Secondary | ICD-10-CM | POA: Diagnosis not present

## 2017-06-26 DIAGNOSIS — H40003 Preglaucoma, unspecified, bilateral: Secondary | ICD-10-CM | POA: Diagnosis not present

## 2017-06-26 DIAGNOSIS — H5203 Hypermetropia, bilateral: Secondary | ICD-10-CM | POA: Diagnosis not present

## 2017-08-28 DIAGNOSIS — L853 Xerosis cutis: Secondary | ICD-10-CM | POA: Diagnosis not present

## 2017-08-28 DIAGNOSIS — M79642 Pain in left hand: Secondary | ICD-10-CM | POA: Diagnosis not present

## 2017-08-28 DIAGNOSIS — M79641 Pain in right hand: Secondary | ICD-10-CM | POA: Diagnosis not present

## 2017-09-01 DIAGNOSIS — K130 Diseases of lips: Secondary | ICD-10-CM | POA: Diagnosis not present

## 2017-09-01 DIAGNOSIS — L821 Other seborrheic keratosis: Secondary | ICD-10-CM | POA: Diagnosis not present

## 2017-09-04 ENCOUNTER — Other Ambulatory Visit: Payer: Self-pay | Admitting: Internal Medicine

## 2017-09-04 DIAGNOSIS — Z1231 Encounter for screening mammogram for malignant neoplasm of breast: Secondary | ICD-10-CM

## 2017-10-05 ENCOUNTER — Ambulatory Visit
Admission: RE | Admit: 2017-10-05 | Discharge: 2017-10-05 | Disposition: A | Discharge status: Home / Self Care | Payer: BLUE CROSS/BLUE SHIELD | Source: Ambulatory Visit | Attending: Internal Medicine | Admitting: Internal Medicine

## 2017-10-05 DIAGNOSIS — Z1231 Encounter for screening mammogram for malignant neoplasm of breast: Secondary | ICD-10-CM

## 2017-11-04 DIAGNOSIS — Z23 Encounter for immunization: Secondary | ICD-10-CM | POA: Diagnosis not present

## 2017-12-07 ENCOUNTER — Other Ambulatory Visit: Payer: Self-pay

## 2017-12-07 ENCOUNTER — Ambulatory Visit (INDEPENDENT_AMBULATORY_CARE_PROVIDER_SITE_OTHER): Payer: BLUE CROSS/BLUE SHIELD | Admitting: Certified Nurse Midwife

## 2017-12-07 ENCOUNTER — Encounter: Payer: Self-pay | Admitting: Certified Nurse Midwife

## 2017-12-07 ENCOUNTER — Other Ambulatory Visit (HOSPITAL_COMMUNITY)
Admission: RE | Admit: 2017-12-07 | Discharge: 2017-12-07 | Disposition: A | Discharge status: Home / Self Care | Payer: BLUE CROSS/BLUE SHIELD | Source: Ambulatory Visit | Attending: Certified Nurse Midwife | Admitting: Certified Nurse Midwife

## 2017-12-07 VITALS — BP 110/80 | HR 64 | Resp 16 | Ht 62.0 in | Wt 163.0 lb

## 2017-12-07 DIAGNOSIS — Z01419 Encounter for gynecological examination (general) (routine) without abnormal findings: Secondary | ICD-10-CM | POA: Diagnosis not present

## 2017-12-07 DIAGNOSIS — Z124 Encounter for screening for malignant neoplasm of cervix: Secondary | ICD-10-CM | POA: Diagnosis not present

## 2017-12-07 DIAGNOSIS — N952 Postmenopausal atrophic vaginitis: Secondary | ICD-10-CM | POA: Diagnosis not present

## 2017-12-07 MED ORDER — ESTRADIOL 10 MCG VA TABS: 1.0000 | ORAL_TABLET | VAGINAL | 4 refills | Status: DC

## 2017-12-07 NOTE — Progress Notes (Signed)
57 y.o. G82P0010 Married  Caucasian Fe here for annual exam. Menopausal no HRT. Denies vaginal bleeding or vaginal dryness.Cholesterol management/aex and labs with Dr. Waynard Edwards yearly. All stable per patient. Patient would like pap smear yearly.No health issues over the past year. Continues with Vagifem use for atrophic vaginitis with good response. No health issues today.  Patient's last menstrual period was 07/09/2010.          Sexually active: Yes.    The current method of family planning is vasectomy.    Exercising: Yes.    walking & biking Smoker:  no  Review of Systems  Constitutional: Negative.   HENT: Negative.   Eyes: Negative.   Respiratory: Negative.   Cardiovascular: Negative.   Gastrointestinal: Negative.   Genitourinary: Negative.   Musculoskeletal: Negative.   Skin: Negative.   Neurological: Negative.   Endo/Heme/Allergies: Negative.   Psychiatric/Behavioral: Negative.     Health Maintenance: Pap:  11-26-15 neg, 11-29-16 neg HPV HR neg History of Abnormal Pap: no MMG:  10-05-17 category b density birads 1:neg Self Breast exams: yes Colonoscopy:  10-24-16 f/u 75yrs BMD:   none TDaP:  2013 Shingles: not done Pneumonia: 12/17 Hep C and HIV: hep c neg 2017 Labs: PCP   reports that she has quit smoking. She has never used smokeless tobacco. She reports that she drinks about 1.0 standard drinks of alcohol per week. She reports that she does not use drugs.  Past Medical History:  Diagnosis Date  . Closed angle glaucoma   . Concussion    09-30-13  . Dyspareunia    dryness  . Pneumonia    possible, took antibiotics & then it looks clear    Past Surgical History:  Procedure Laterality Date  . IRIDOTOMY / IRIDECTOMY     laser both eyes  . OTHER SURGICAL HISTORY     adenoids only age 74  . SHOULDER SURGERY Right 12/2014  . THYROGLOSSAL DUCT CYST  1996   removal    Current Outpatient Medications  Medication Sig Dispense Refill  . atorvastatin (LIPITOR) 10 MG  tablet Take 10 mg by mouth daily.  11  . Estradiol 10 MCG TABS vaginal tablet Place 1 tablet (10 mcg total) vaginally 2 (two) times a week. 24 tablet 4  . ibuprofen (ADVIL,MOTRIN) 400 MG tablet Take 400 mg by mouth every 6 (six) hours as needed.    . polyvinyl alcohol-povidone (REFRESH) 1.4-0.6 % ophthalmic solution Place 1 drop into both eyes every morning.    . Probiotic Product (PROBIOTIC PO) Take by mouth daily.     No current facility-administered medications for this visit.     Family History  Problem Relation Age of Onset  . Hypertension Mother   . Breast cancer Mother   . Diabetes Father   . Hypertension Father   . Heart disease Father   . Diabetes Maternal Uncle   . Hypertension Maternal Grandmother   . Stroke Maternal Grandmother   . Breast cancer Paternal Grandmother   . Hypertension Paternal Grandmother   . Thyroid disease Paternal Grandmother   . Other Sister        RA    ROS:  Pertinent items are noted in HPI.  Otherwise, a comprehensive ROS was negative.  Exam:   BP 110/80   Pulse 64   Resp 16   Ht 5\' 2"  (1.575 m)   Wt 163 lb (73.9 kg)   LMP 07/09/2010   BMI 29.81 kg/m  Height: 5\' 2"  (157.5 cm) Ht Readings  from Last 3 Encounters:  12/07/17 5\' 2"  (1.575 m)  11/29/16 5' 1.75" (1.568 m)  11/26/15 5' 1.75" (1.568 m)    General appearance: alert, cooperative and appears stated age Head: Normocephalic, without obvious abnormality, atraumatic Neck: no adenopathy, supple, symmetrical, trachea midline and thyroid normal to inspection and palpation Lungs: clear to auscultation bilaterally Breasts: normal appearance, no masses or tenderness, No nipple retraction or dimpling, No nipple discharge or bleeding, No axillary or supraclavicular adenopathy Heart: regular rate and rhythm Abdomen: soft, non-tender; no masses,  no organomegaly Extremities: extremities normal, atraumatic, no cyanosis or edema Skin: Skin color, texture, turgor normal. No rashes or  lesions Lymph nodes: Cervical, supraclavicular, and axillary nodes normal. No abnormal inguinal nodes palpated Neurologic: Grossly normal   Pelvic: External genitalia:  no lesions              Urethra:  normal appearing urethra with no masses, tenderness or lesions              Bartholin's and Skene's: normal                 Vagina: normal appearing vagina with normal color and discharge, no lesions              Cervix: no cervical motion tenderness, no lesions and normal appearance              Pap taken: Yes.   Bimanual Exam:  Uterus:  normal size, contour, position, consistency, mobility, non-tender and anteverted              Adnexa: normal adnexa and no mass, fullness, tenderness               Rectovaginal: Confirms               Anus:  normal sphincter tone, no lesions  Chaperone present: yes  A:  Well Woman with normal exam  Menopausal no HRT  Atrophic vaginitis with Vagifem use with good results  Cholesterol management with PCP  Family history of breast cancer mother  P:   Reviewed health and wellness pertinent to exam  Aware of need to advise if vaginal bleeding  Risks/benefits/warning signs of Vagifem use, desires continuance  Rx Vagifem see order with instructions use twice weekly  Continue follow up with PCP as indicated  Stressed importance of SBE monthly and mammogram yearly. Aware of genetic screening.  Pap smear: yes   counseled on breast self exam, mammography screening, feminine hygiene, adequate intake of calcium and vitamin D, diet and exercise  return annually or prn  An After Visit Summary was printed and given to the patient.

## 2017-12-11 LAB — CYTOLOGY - PAP
DIAGNOSIS: NEGATIVE
HPV (WINDOPATH): NOT DETECTED

## 2018-01-29 DIAGNOSIS — Z6829 Body mass index (BMI) 29.0-29.9, adult: Secondary | ICD-10-CM | POA: Diagnosis not present

## 2018-01-29 DIAGNOSIS — J069 Acute upper respiratory infection, unspecified: Secondary | ICD-10-CM | POA: Diagnosis not present

## 2018-02-23 DIAGNOSIS — R82998 Other abnormal findings in urine: Secondary | ICD-10-CM | POA: Diagnosis not present

## 2018-02-23 DIAGNOSIS — H6980 Other specified disorders of Eustachian tube, unspecified ear: Secondary | ICD-10-CM | POA: Diagnosis not present

## 2018-02-23 DIAGNOSIS — R5383 Other fatigue: Secondary | ICD-10-CM | POA: Diagnosis not present

## 2018-02-23 DIAGNOSIS — Z6830 Body mass index (BMI) 30.0-30.9, adult: Secondary | ICD-10-CM | POA: Diagnosis not present

## 2018-02-23 DIAGNOSIS — Z0000 Encounter for general adult medical examination without abnormal findings: Secondary | ICD-10-CM | POA: Diagnosis not present

## 2018-03-02 DIAGNOSIS — J069 Acute upper respiratory infection, unspecified: Secondary | ICD-10-CM | POA: Diagnosis not present

## 2018-03-02 DIAGNOSIS — Z1331 Encounter for screening for depression: Secondary | ICD-10-CM | POA: Diagnosis not present

## 2018-03-02 DIAGNOSIS — Z0000 Encounter for general adult medical examination without abnormal findings: Secondary | ICD-10-CM | POA: Diagnosis not present

## 2018-03-02 DIAGNOSIS — H6980 Other specified disorders of Eustachian tube, unspecified ear: Secondary | ICD-10-CM | POA: Diagnosis not present

## 2018-03-02 DIAGNOSIS — M79641 Pain in right hand: Secondary | ICD-10-CM | POA: Diagnosis not present

## 2018-03-02 DIAGNOSIS — L853 Xerosis cutis: Secondary | ICD-10-CM | POA: Diagnosis not present

## 2018-03-07 DIAGNOSIS — Z1212 Encounter for screening for malignant neoplasm of rectum: Secondary | ICD-10-CM | POA: Diagnosis not present

## 2018-03-15 DIAGNOSIS — D2239 Melanocytic nevi of other parts of face: Secondary | ICD-10-CM | POA: Diagnosis not present

## 2018-03-15 DIAGNOSIS — L821 Other seborrheic keratosis: Secondary | ICD-10-CM | POA: Diagnosis not present

## 2018-03-15 DIAGNOSIS — L578 Other skin changes due to chronic exposure to nonionizing radiation: Secondary | ICD-10-CM | POA: Diagnosis not present

## 2018-03-15 DIAGNOSIS — D2262 Melanocytic nevi of left upper limb, including shoulder: Secondary | ICD-10-CM | POA: Diagnosis not present

## 2018-05-21 DIAGNOSIS — T22131S Burn of first degree of right upper arm, sequela: Secondary | ICD-10-CM | POA: Diagnosis not present

## 2018-06-20 DIAGNOSIS — G43909 Migraine, unspecified, not intractable, without status migrainosus: Secondary | ICD-10-CM | POA: Diagnosis not present

## 2018-07-09 DIAGNOSIS — H5203 Hypermetropia, bilateral: Secondary | ICD-10-CM | POA: Diagnosis not present

## 2018-07-09 DIAGNOSIS — H40033 Anatomical narrow angle, bilateral: Secondary | ICD-10-CM | POA: Diagnosis not present

## 2018-07-09 DIAGNOSIS — H524 Presbyopia: Secondary | ICD-10-CM | POA: Diagnosis not present

## 2018-07-09 DIAGNOSIS — H2513 Age-related nuclear cataract, bilateral: Secondary | ICD-10-CM | POA: Diagnosis not present

## 2018-07-10 DIAGNOSIS — Z7289 Other problems related to lifestyle: Secondary | ICD-10-CM | POA: Diagnosis not present

## 2018-07-10 DIAGNOSIS — H9313 Tinnitus, bilateral: Secondary | ICD-10-CM | POA: Diagnosis not present

## 2018-07-10 DIAGNOSIS — H6981 Other specified disorders of Eustachian tube, right ear: Secondary | ICD-10-CM | POA: Diagnosis not present

## 2018-07-10 DIAGNOSIS — H6990 Unspecified Eustachian tube disorder, unspecified ear: Secondary | ICD-10-CM | POA: Insufficient documentation

## 2018-07-10 DIAGNOSIS — Z0110 Encounter for examination of ears and hearing without abnormal findings: Secondary | ICD-10-CM | POA: Diagnosis not present

## 2018-07-10 DIAGNOSIS — H6980 Other specified disorders of Eustachian tube, unspecified ear: Secondary | ICD-10-CM | POA: Insufficient documentation

## 2018-09-19 ENCOUNTER — Other Ambulatory Visit: Payer: Self-pay | Admitting: Internal Medicine

## 2018-09-19 DIAGNOSIS — Z1231 Encounter for screening mammogram for malignant neoplasm of breast: Secondary | ICD-10-CM

## 2018-10-25 ENCOUNTER — Ambulatory Visit
Admission: RE | Admit: 2018-10-25 | Discharge: 2018-10-25 | Disposition: A | Discharge status: Home / Self Care | Payer: BC Managed Care – PPO | Source: Ambulatory Visit | Attending: Internal Medicine | Admitting: Internal Medicine

## 2018-10-25 ENCOUNTER — Other Ambulatory Visit: Payer: Self-pay

## 2018-10-25 DIAGNOSIS — Z1231 Encounter for screening mammogram for malignant neoplasm of breast: Secondary | ICD-10-CM

## 2018-11-02 DIAGNOSIS — Z23 Encounter for immunization: Secondary | ICD-10-CM | POA: Diagnosis not present

## 2018-12-12 ENCOUNTER — Other Ambulatory Visit: Payer: Self-pay

## 2018-12-13 ENCOUNTER — Ambulatory Visit: Payer: BLUE CROSS/BLUE SHIELD | Admitting: Certified Nurse Midwife

## 2018-12-14 ENCOUNTER — Encounter: Payer: Self-pay | Admitting: Certified Nurse Midwife

## 2018-12-14 ENCOUNTER — Other Ambulatory Visit: Payer: Self-pay

## 2018-12-14 ENCOUNTER — Other Ambulatory Visit (HOSPITAL_COMMUNITY)
Admission: RE | Admit: 2018-12-14 | Discharge: 2018-12-14 | Disposition: A | Discharge status: Home / Self Care | Payer: BC Managed Care – PPO | Source: Ambulatory Visit | Attending: Certified Nurse Midwife | Admitting: Certified Nurse Midwife

## 2018-12-14 ENCOUNTER — Ambulatory Visit (INDEPENDENT_AMBULATORY_CARE_PROVIDER_SITE_OTHER): Payer: BC Managed Care – PPO | Admitting: Certified Nurse Midwife

## 2018-12-14 VITALS — BP 120/78 | HR 64 | Temp 97.1°F | Resp 16 | Ht 61.75 in | Wt 171.0 lb

## 2018-12-14 DIAGNOSIS — Z124 Encounter for screening for malignant neoplasm of cervix: Secondary | ICD-10-CM | POA: Insufficient documentation

## 2018-12-14 DIAGNOSIS — Z780 Asymptomatic menopausal state: Secondary | ICD-10-CM

## 2018-12-14 DIAGNOSIS — Z01419 Encounter for gynecological examination (general) (routine) without abnormal findings: Secondary | ICD-10-CM | POA: Diagnosis not present

## 2018-12-14 DIAGNOSIS — N952 Postmenopausal atrophic vaginitis: Secondary | ICD-10-CM | POA: Diagnosis not present

## 2018-12-14 DIAGNOSIS — Z8639 Personal history of other endocrine, nutritional and metabolic disease: Secondary | ICD-10-CM

## 2018-12-14 MED ORDER — ESTRADIOL 10 MCG VA TABS: 1.0000 | ORAL_TABLET | VAGINAL | 4 refills | Status: DC

## 2018-12-14 NOTE — Progress Notes (Signed)
58 y.o. G61P0010 Married  Caucasian Fe here for annual exam. Menopausal no HRT except Yuvafem which is working well twice weekly. Sees Dr. Joylene Draft for aex, cholesterol management and labs. Requests pap smear yearly. Aware she has gained about 5 pounds and working on exercise and better diet. No health issues today.  Patient's last menstrual period was 07/09/2010.          Sexually active: Yes.    The current method of family planning is vasectomy.    Exercising: No.  exercise but usually walk Smoker:  no  Review of Systems  Constitutional: Negative.   HENT: Negative.   Eyes: Negative.   Respiratory: Negative.   Cardiovascular: Negative.   Gastrointestinal: Negative.   Genitourinary: Negative.   Musculoskeletal: Negative.   Skin: Negative.   Neurological: Negative.   Endo/Heme/Allergies: Negative.   Psychiatric/Behavioral: Negative.     Health Maintenance: Pap:  11-29-16 neg HPV HR neg, 12-07-17 neg HPV HR neg History of Abnormal Pap: no MMG:  10-25-2018 category c density birads 1:neg Self Breast exams: yes Colonoscopy:  2018 f/u 61yrs BMD:   none TDaP:  2013 Shingles: not done Pneumonia: 2017 Hep C and HIV: hep c neg 2017 Labs: with PCP   reports that she has quit smoking. She has never used smokeless tobacco. She reports current alcohol use of about 1.0 standard drinks of alcohol per week. She reports that she does not use drugs.  Past Medical History:  Diagnosis Date  . Closed angle glaucoma   . Concussion    09-30-13  . Dyspareunia    dryness  . Pneumonia    possible, took antibiotics & then it looks clear    Past Surgical History:  Procedure Laterality Date  . IRIDOTOMY / IRIDECTOMY     laser both eyes  . OTHER SURGICAL HISTORY     adenoids only age 55  . SHOULDER SURGERY Right 12/2014  . THYROGLOSSAL DUCT CYST  1996   removal    Current Outpatient Medications  Medication Sig Dispense Refill  . atorvastatin (LIPITOR) 10 MG tablet Take 10 mg by mouth  daily.  11  . Estradiol 10 MCG TABS vaginal tablet Place 1 tablet (10 mcg total) vaginally 2 (two) times a week. 24 tablet 4  . ibuprofen (ADVIL,MOTRIN) 400 MG tablet Take 400 mg by mouth every 6 (six) hours as needed.    . polyvinyl alcohol-povidone (REFRESH) 1.4-0.6 % ophthalmic solution Place 1 drop into both eyes every morning.    . Probiotic Product (PROBIOTIC PO) Take by mouth daily.     No current facility-administered medications for this visit.     Family History  Problem Relation Age of Onset  . Hypertension Mother   . Breast cancer Mother   . Diabetes Father   . Hypertension Father   . Heart disease Father   . Diabetes Maternal Uncle   . Hypertension Maternal Grandmother   . Stroke Maternal Grandmother   . Breast cancer Paternal Grandmother   . Hypertension Paternal Grandmother   . Thyroid disease Paternal Grandmother   . Other Sister        RA    ROS:  Pertinent items are noted in HPI.  Otherwise, a comprehensive ROS was negative.  Exam:   LMP 07/09/2010    Ht Readings from Last 3 Encounters:  12/07/17 5\' 2"  (1.575 m)  11/29/16 5' 1.75" (1.568 m)  11/26/15 5' 1.75" (1.568 m)    General appearance: alert, cooperative and appears stated age Head:  Normocephalic, without obvious abnormality, atraumatic Neck: no adenopathy, supple, symmetrical, trachea midline and thyroid normal to inspection and palpation Lungs: clear to auscultation bilaterally Breasts: normal appearance, no masses or tenderness, No nipple retraction or dimpling, No nipple discharge or bleeding, No axillary or supraclavicular adenopathy Heart: regular rate and rhythm Abdomen: soft, non-tender; no masses,  no organomegaly Extremities: extremities normal, atraumatic, no cyanosis or edema Skin: Skin color, texture, turgor normal. No rashes or lesions Lymph nodes: Cervical, supraclavicular, and axillary nodes normal. No abnormal inguinal nodes palpated Neurologic: Grossly normal   Pelvic:  External genitalia:  no lesions, atrophic appearance around vaginal opening              Urethra:  normal appearing urethra with no masses, tenderness or lesions              Bartholin's and Skene's: normal                 Vagina: normal appearing vagina with normal color and discharge, no lesions              Cervix: multiparous appearance, no cervical motion tenderness and no lesions              Pap taken: Yes.   Bimanual Exam:  Uterus:  normal size, contour, position, consistency, mobility, non-tender and anteverted              Adnexa: normal adnexa and no mass, fullness, tenderness               Rectovaginal: Confirms               Anus:  normal sphincter tone, no lesions  Chaperone present: yes  A:  Well Woman with normal exam  Menopausal no HRT  Atrophic vaginitis using Yuvafem with good results.  Cholesterol management with PCP, stable per patient    P:   Reviewed health and wellness pertinent to exam  Aware of need to advise if vaginal bleeding  Risks/benefits/warning signs with Yuvafem use discussed, desires continuance.  Rx Yuvafem see order with instructions  Continue follow up with PCP as indicated.  Pap smear: yes   counseled on breast self exam, mammography screening, feminine hygiene, menopause, adequate intake of calcium and vitamin D, diet and exercise  return annually or prn  An After Visit Summary was printed and given to the patient.

## 2018-12-14 NOTE — Patient Instructions (Signed)

## 2018-12-19 LAB — CYTOLOGY - PAP: Diagnosis: NEGATIVE

## 2018-12-25 ENCOUNTER — Other Ambulatory Visit: Payer: Self-pay | Admitting: Certified Nurse Midwife

## 2018-12-25 DIAGNOSIS — N952 Postmenopausal atrophic vaginitis: Secondary | ICD-10-CM

## 2018-12-25 NOTE — Telephone Encounter (Signed)
[facility] [place] for Yuvafem on 12-14-18. Sent Rx again.   Spoke with pt. Pt notified that [facility] did not have [facility] and was sent again for med refill for Yuvafem. Pt thankful for call.  Routing to provider for final review. Patient is agreeable to disposition. Will close encounter.

## 2018-12-25 NOTE — Telephone Encounter (Signed)
Patient calling to check status of prescription.

## 2019-01-02 ENCOUNTER — Other Ambulatory Visit: Payer: Self-pay

## 2019-01-02 DIAGNOSIS — Z20822 Contact with and (suspected) exposure to covid-19: Secondary | ICD-10-CM

## 2019-01-03 LAB — NOVEL CORONAVIRUS, NAA: SARS-CoV-2, NAA: NOT DETECTED

## 2019-03-18 DIAGNOSIS — L308 Other specified dermatitis: Secondary | ICD-10-CM | POA: Diagnosis not present

## 2019-03-18 DIAGNOSIS — L812 Freckles: Secondary | ICD-10-CM | POA: Diagnosis not present

## 2019-03-18 DIAGNOSIS — L821 Other seborrheic keratosis: Secondary | ICD-10-CM | POA: Diagnosis not present

## 2019-03-18 DIAGNOSIS — D225 Melanocytic nevi of trunk: Secondary | ICD-10-CM | POA: Diagnosis not present

## 2019-04-04 DIAGNOSIS — E7849 Other hyperlipidemia: Secondary | ICD-10-CM | POA: Diagnosis not present

## 2019-04-04 DIAGNOSIS — Z0000 Encounter for general adult medical examination without abnormal findings: Secondary | ICD-10-CM | POA: Diagnosis not present

## 2019-04-09 DIAGNOSIS — R82998 Other abnormal findings in urine: Secondary | ICD-10-CM | POA: Diagnosis not present

## 2019-04-11 DIAGNOSIS — R030 Elevated blood-pressure reading, without diagnosis of hypertension: Secondary | ICD-10-CM | POA: Diagnosis not present

## 2019-04-11 DIAGNOSIS — Z0000 Encounter for general adult medical examination without abnormal findings: Secondary | ICD-10-CM | POA: Diagnosis not present

## 2019-04-11 DIAGNOSIS — M5030 Other cervical disc degeneration, unspecified cervical region: Secondary | ICD-10-CM | POA: Diagnosis not present

## 2019-04-11 DIAGNOSIS — E785 Hyperlipidemia, unspecified: Secondary | ICD-10-CM | POA: Diagnosis not present

## 2019-04-11 DIAGNOSIS — M79643 Pain in unspecified hand: Secondary | ICD-10-CM | POA: Diagnosis not present

## 2019-04-11 DIAGNOSIS — Z1331 Encounter for screening for depression: Secondary | ICD-10-CM | POA: Diagnosis not present

## 2019-04-26 ENCOUNTER — Ambulatory Visit: Payer: BC Managed Care – PPO

## 2019-04-29 ENCOUNTER — Encounter: Payer: Self-pay | Admitting: Certified Nurse Midwife

## 2019-04-30 DIAGNOSIS — Z1212 Encounter for screening for malignant neoplasm of rectum: Secondary | ICD-10-CM | POA: Diagnosis not present

## 2019-06-27 DIAGNOSIS — L309 Dermatitis, unspecified: Secondary | ICD-10-CM | POA: Diagnosis not present

## 2019-07-11 DIAGNOSIS — L308 Other specified dermatitis: Secondary | ICD-10-CM | POA: Diagnosis not present

## 2019-07-11 DIAGNOSIS — H40033 Anatomical narrow angle, bilateral: Secondary | ICD-10-CM | POA: Diagnosis not present

## 2019-07-11 DIAGNOSIS — H2513 Age-related nuclear cataract, bilateral: Secondary | ICD-10-CM | POA: Diagnosis not present

## 2019-07-11 DIAGNOSIS — H5203 Hypermetropia, bilateral: Secondary | ICD-10-CM | POA: Diagnosis not present

## 2019-07-11 DIAGNOSIS — H524 Presbyopia: Secondary | ICD-10-CM | POA: Diagnosis not present

## 2019-08-06 DIAGNOSIS — R0982 Postnasal drip: Secondary | ICD-10-CM | POA: Diagnosis not present

## 2019-08-06 DIAGNOSIS — H6981 Other specified disorders of Eustachian tube, right ear: Secondary | ICD-10-CM | POA: Diagnosis not present

## 2019-08-06 DIAGNOSIS — K219 Gastro-esophageal reflux disease without esophagitis: Secondary | ICD-10-CM | POA: Diagnosis not present

## 2019-09-25 ENCOUNTER — Other Ambulatory Visit: Payer: Self-pay | Admitting: Internal Medicine

## 2019-09-25 DIAGNOSIS — Z1231 Encounter for screening mammogram for malignant neoplasm of breast: Secondary | ICD-10-CM

## 2019-10-08 DIAGNOSIS — H938X1 Other specified disorders of right ear: Secondary | ICD-10-CM | POA: Diagnosis not present

## 2019-10-08 DIAGNOSIS — R0982 Postnasal drip: Secondary | ICD-10-CM | POA: Diagnosis not present

## 2019-11-04 ENCOUNTER — Other Ambulatory Visit: Payer: Self-pay

## 2019-11-04 ENCOUNTER — Ambulatory Visit
Admission: RE | Admit: 2019-11-04 | Discharge: 2019-11-04 | Disposition: A | Discharge status: Home / Self Care | Payer: BC Managed Care – PPO | Source: Ambulatory Visit

## 2019-11-04 DIAGNOSIS — Z1231 Encounter for screening mammogram for malignant neoplasm of breast: Secondary | ICD-10-CM

## 2019-11-13 DIAGNOSIS — Z23 Encounter for immunization: Secondary | ICD-10-CM | POA: Diagnosis not present

## 2019-12-16 NOTE — Progress Notes (Signed)
59 y.o. G34P0010 Married White or Caucasian female here for annual exam.     Pt feels healthy, no specific concerns today, would like a refill on vaginal estrogen. Still has problems with dryness and pain with sex.  Pt is a retired Airline pilot. Has step children and 2 grandchildren that live in [place]. FmHx mother -Breast cancer in 105's  Patient's last menstrual period was 07/09/2010.          Sexually active: Yes.    The current method of family planning is vasectomy.    Exercising: Yes.    walking & workout videos Smoker:  no  Health Maintenance: Pap:  12-07-17 neg HPV HR neg, 12-14-2018 neg History of abnormal Pap:  no MMG:  11-05-2019 category b density birads 1:neg Colonoscopy:  2018 f/u 62yrs BMD:   none TDaP:  2013 Gardasil:   n/a Covid-19: done Pneumonia vaccine(s):  2017 Shingrix:   Not done Hep C testing: neg 2017 Screening Labs: done with PCP   reports that she has quit smoking. She has never used smokeless tobacco. She reports current alcohol use of about 1.0 standard drink of alcohol per week. She reports that she does not use drugs.  Past Medical History:  Diagnosis Date  . Closed angle glaucoma   . Concussion    09-30-13  . Dyspareunia    dryness  . Pneumonia    possible, took antibiotics & then it looks clear    Past Surgical History:  Procedure Laterality Date  . IRIDOTOMY / IRIDECTOMY     laser both eyes  . OTHER SURGICAL HISTORY     adenoids only age 48  . SHOULDER SURGERY Right 12/2014  . THYROGLOSSAL DUCT CYST  1996   removal    Current Outpatient Medications  Medication Sig Dispense Refill  . atorvastatin (LIPITOR) 10 MG tablet Take 10 mg by mouth daily.  11  . ibuprofen (ADVIL,MOTRIN) 400 MG tablet Take 400 mg by mouth every 6 (six) hours as needed.    . pantoprazole (PROTONIX) 40 MG tablet Take 40 mg by mouth daily.    . polyvinyl alcohol-povidone (REFRESH) 1.4-0.6 % ophthalmic solution Place 1 drop into both eyes every morning.    .  Probiotic Product (ALIGN PO) Take by mouth.    Anson Fret 10 MCG TABS vaginal tablet PLACE 1 TABLET (10 MCG TOTAL) VAGINALLY 2 (TWO) TIMES A WEEK. 24 tablet 4   No current facility-administered medications for this visit.    Family History  Problem Relation Age of Onset  . Hypertension Mother   . Breast cancer Mother   . Diabetes Father   . Hypertension Father   . Heart disease Father   . Diabetes Maternal Uncle   . Hypertension Maternal Grandmother   . Stroke Maternal Grandmother   . Breast cancer Paternal Grandmother   . Hypertension Paternal Grandmother   . Thyroid disease Paternal Grandmother   . Other Sister        RA    Review of Systems  Constitutional: Negative.   HENT: Negative.   Eyes: Negative.   Respiratory: Negative.   Cardiovascular: Negative.   Gastrointestinal: Negative.   Endocrine: Negative.   Genitourinary: Negative.   Musculoskeletal: Negative.   Skin: Negative.   Allergic/Immunologic: Negative.   Neurological: Negative.   Hematological: Negative.   Psychiatric/Behavioral: Negative.     Exam:   BP 108/70   Pulse 68   Resp 16   Ht 5' 1.75" (1.568 m)   Wt 172 lb (78  kg)   LMP 07/09/2010   BMI 31.71 kg/m   Height: 5' 1.75" (156.8 cm)  General appearance: alert, cooperative and appears stated age Head: Normocephalic, without obvious abnormality, atraumatic Neck: no adenopathy, supple, symmetrical, trachea midline and thyroid normal to inspection and palpation Lungs: clear to auscultation bilaterally Breasts: normal appearance, no masses or tenderness Heart: regular rate and rhythm Abdomen: soft, non-tender; bowel sounds normal; no masses,  no organomegaly Extremities: extremities normal, atraumatic, no cyanosis or edema Skin: Skin color, texture, turgor normal. No rashes or lesions Lymph nodes: Cervical, supraclavicular, and axillary nodes normal. No abnormal inguinal nodes palpated Neurologic: Grossly normal  Physical Exam Genitourinary:           Pelvic: External genitalia:  no lesions              Urethra:  Small caruncle, red, appears to be originating from urethal opening see above diagram              Bartholins and Skenes: normal                 Vagina: atrophic vagina with scant discharge, no lesions              Cervix: no cervical motion tenderness and no lesions              Pap taken: No. Bimanual Exam:    Uterus:  normal size, contour, position, consistency, mobility, non-tender              Adnexa: no mass, fullness, tenderness               Rectovaginal: Confirms               Anus:  normal sphincter tone, no lesions   CMA Chaperone was present for exam.  A:  Well Woman with normal exam  Urethral caruncle, asymptomatic  Vaginal atrophy  Urethral Caruncle  P:   Mammogram UTD, done 10/2019, normal  pap smear due 2023. Discussed testing interval  Refill Vaginal estrogen:Yuvafem twice weekly, sent to pharmacy  return annually or prn

## 2019-12-17 ENCOUNTER — Other Ambulatory Visit: Payer: Self-pay

## 2019-12-17 ENCOUNTER — Encounter: Payer: Self-pay | Admitting: Nurse Practitioner

## 2019-12-17 ENCOUNTER — Ambulatory Visit (INDEPENDENT_AMBULATORY_CARE_PROVIDER_SITE_OTHER): Payer: BC Managed Care – PPO | Admitting: Nurse Practitioner

## 2019-12-17 ENCOUNTER — Ambulatory Visit: Payer: BC Managed Care – PPO | Admitting: Certified Nurse Midwife

## 2019-12-17 VITALS — BP 108/70 | HR 68 | Resp 16 | Ht 61.75 in | Wt 172.0 lb

## 2019-12-17 DIAGNOSIS — N362 Urethral caruncle: Secondary | ICD-10-CM

## 2019-12-17 DIAGNOSIS — N952 Postmenopausal atrophic vaginitis: Secondary | ICD-10-CM | POA: Diagnosis not present

## 2019-12-17 DIAGNOSIS — Z01419 Encounter for gynecological examination (general) (routine) without abnormal findings: Secondary | ICD-10-CM

## 2019-12-17 MED ORDER — ESTRADIOL 10 MCG VA TABS: ORAL_TABLET | VAGINAL | 4 refills | Status: DC

## 2019-12-17 NOTE — Patient Instructions (Addendum)
Nice to meet you today. Next pap smear due 2023 Consider Calcium 1200mg  and Vit D 800 iu daily for bone protection. I will call you later today after discussing exam with the attending MD and let you know if there are any additional recommendations.  After speaking to Dr. , we came to the conclusion that the structure I identified today is called a urethral caruncle. In non symptomatic women, no treatment is necessary.  If symptoms do occur, you may notice bleeding, irritation, or discomfort with urination. Let me know if any problems.

## 2020-03-24 DIAGNOSIS — L821 Other seborrheic keratosis: Secondary | ICD-10-CM | POA: Diagnosis not present

## 2020-03-24 DIAGNOSIS — L308 Other specified dermatitis: Secondary | ICD-10-CM | POA: Diagnosis not present

## 2020-03-24 DIAGNOSIS — L820 Inflamed seborrheic keratosis: Secondary | ICD-10-CM | POA: Diagnosis not present

## 2020-03-24 DIAGNOSIS — L812 Freckles: Secondary | ICD-10-CM | POA: Diagnosis not present

## 2020-03-24 DIAGNOSIS — D225 Melanocytic nevi of trunk: Secondary | ICD-10-CM | POA: Diagnosis not present

## 2020-04-09 DIAGNOSIS — E785 Hyperlipidemia, unspecified: Secondary | ICD-10-CM | POA: Diagnosis not present

## 2020-04-09 DIAGNOSIS — Z0000 Encounter for general adult medical examination without abnormal findings: Secondary | ICD-10-CM | POA: Diagnosis not present

## 2020-04-09 DIAGNOSIS — Z13820 Encounter for screening for osteoporosis: Secondary | ICD-10-CM | POA: Diagnosis not present

## 2020-04-14 DIAGNOSIS — Z79899 Other long term (current) drug therapy: Secondary | ICD-10-CM | POA: Diagnosis not present

## 2020-04-14 DIAGNOSIS — R030 Elevated blood-pressure reading, without diagnosis of hypertension: Secondary | ICD-10-CM | POA: Diagnosis not present

## 2020-04-14 DIAGNOSIS — Z0000 Encounter for general adult medical examination without abnormal findings: Secondary | ICD-10-CM | POA: Diagnosis not present

## 2020-04-14 DIAGNOSIS — M810 Age-related osteoporosis without current pathological fracture: Secondary | ICD-10-CM | POA: Diagnosis not present

## 2020-04-14 DIAGNOSIS — Z23 Encounter for immunization: Secondary | ICD-10-CM | POA: Diagnosis not present

## 2020-04-14 DIAGNOSIS — R82998 Other abnormal findings in urine: Secondary | ICD-10-CM | POA: Diagnosis not present

## 2020-04-14 DIAGNOSIS — Z1331 Encounter for screening for depression: Secondary | ICD-10-CM | POA: Diagnosis not present

## 2020-05-06 DIAGNOSIS — M25552 Pain in left hip: Secondary | ICD-10-CM | POA: Diagnosis not present

## 2020-06-18 DIAGNOSIS — K219 Gastro-esophageal reflux disease without esophagitis: Secondary | ICD-10-CM | POA: Diagnosis not present

## 2020-06-18 DIAGNOSIS — R1013 Epigastric pain: Secondary | ICD-10-CM | POA: Diagnosis not present

## 2020-06-18 DIAGNOSIS — K5904 Chronic idiopathic constipation: Secondary | ICD-10-CM | POA: Diagnosis not present

## 2020-06-18 DIAGNOSIS — R140 Abdominal distension (gaseous): Secondary | ICD-10-CM | POA: Diagnosis not present

## 2020-07-20 DIAGNOSIS — H40033 Anatomical narrow angle, bilateral: Secondary | ICD-10-CM | POA: Diagnosis not present

## 2020-07-20 DIAGNOSIS — H2513 Age-related nuclear cataract, bilateral: Secondary | ICD-10-CM | POA: Diagnosis not present

## 2020-07-22 DIAGNOSIS — K222 Esophageal obstruction: Secondary | ICD-10-CM | POA: Diagnosis not present

## 2020-07-22 DIAGNOSIS — R1314 Dysphagia, pharyngoesophageal phase: Secondary | ICD-10-CM | POA: Diagnosis not present

## 2020-07-22 DIAGNOSIS — K2930 Chronic superficial gastritis without bleeding: Secondary | ICD-10-CM | POA: Diagnosis not present

## 2020-07-22 DIAGNOSIS — R1013 Epigastric pain: Secondary | ICD-10-CM | POA: Diagnosis not present

## 2020-07-22 DIAGNOSIS — K317 Polyp of stomach and duodenum: Secondary | ICD-10-CM | POA: Diagnosis not present

## 2020-07-22 DIAGNOSIS — R140 Abdominal distension (gaseous): Secondary | ICD-10-CM | POA: Diagnosis not present

## 2020-08-06 ENCOUNTER — Ambulatory Visit: Payer: BC Managed Care – PPO | Admitting: Obstetrics & Gynecology

## 2020-08-19 ENCOUNTER — Other Ambulatory Visit: Payer: Self-pay | Admitting: Physician Assistant

## 2020-08-19 DIAGNOSIS — K5900 Constipation, unspecified: Secondary | ICD-10-CM | POA: Diagnosis not present

## 2020-08-19 DIAGNOSIS — R1033 Periumbilical pain: Secondary | ICD-10-CM | POA: Diagnosis not present

## 2020-08-19 DIAGNOSIS — R634 Abnormal weight loss: Secondary | ICD-10-CM | POA: Diagnosis not present

## 2020-08-19 DIAGNOSIS — R140 Abdominal distension (gaseous): Secondary | ICD-10-CM | POA: Diagnosis not present

## 2020-08-19 DIAGNOSIS — K219 Gastro-esophageal reflux disease without esophagitis: Secondary | ICD-10-CM | POA: Diagnosis not present

## 2020-09-04 ENCOUNTER — Other Ambulatory Visit: Payer: Self-pay

## 2020-09-04 ENCOUNTER — Ambulatory Visit
Admission: RE | Admit: 2020-09-04 | Discharge: 2020-09-04 | Disposition: A | Discharge status: Home / Self Care | Payer: BC Managed Care – PPO | Source: Ambulatory Visit | Attending: Physician Assistant | Admitting: Physician Assistant

## 2020-09-04 DIAGNOSIS — R634 Abnormal weight loss: Secondary | ICD-10-CM | POA: Diagnosis not present

## 2020-09-04 DIAGNOSIS — I700 Atherosclerosis of aorta: Secondary | ICD-10-CM | POA: Diagnosis not present

## 2020-09-04 DIAGNOSIS — R1033 Periumbilical pain: Secondary | ICD-10-CM | POA: Diagnosis not present

## 2020-09-04 DIAGNOSIS — R140 Abdominal distension (gaseous): Secondary | ICD-10-CM | POA: Diagnosis not present

## 2020-09-04 MED ORDER — IOPAMIDOL (ISOVUE-300) INJECTION 61%
100.0000 mL | Freq: Once | INTRAVENOUS | Status: AC | PRN
  Administered 2020-09-04: 100 mL via INTRAVENOUS

## 2020-10-15 ENCOUNTER — Other Ambulatory Visit: Payer: Self-pay | Admitting: Internal Medicine

## 2020-10-15 DIAGNOSIS — Z1231 Encounter for screening mammogram for malignant neoplasm of breast: Secondary | ICD-10-CM

## 2020-10-19 DIAGNOSIS — Z23 Encounter for immunization: Secondary | ICD-10-CM | POA: Diagnosis not present

## 2020-10-19 DIAGNOSIS — I700 Atherosclerosis of aorta: Secondary | ICD-10-CM | POA: Diagnosis not present

## 2020-11-25 DIAGNOSIS — R051 Acute cough: Secondary | ICD-10-CM | POA: Diagnosis not present

## 2020-11-25 DIAGNOSIS — J3489 Other specified disorders of nose and nasal sinuses: Secondary | ICD-10-CM | POA: Diagnosis not present

## 2020-11-27 DIAGNOSIS — K219 Gastro-esophageal reflux disease without esophagitis: Secondary | ICD-10-CM | POA: Diagnosis not present

## 2020-11-27 DIAGNOSIS — Z1231 Encounter for screening mammogram for malignant neoplasm of breast: Secondary | ICD-10-CM

## 2020-11-27 DIAGNOSIS — K5900 Constipation, unspecified: Secondary | ICD-10-CM | POA: Diagnosis not present

## 2020-12-08 ENCOUNTER — Other Ambulatory Visit: Payer: Self-pay | Admitting: Internal Medicine

## 2020-12-08 DIAGNOSIS — Z1231 Encounter for screening mammogram for malignant neoplasm of breast: Secondary | ICD-10-CM

## 2020-12-10 ENCOUNTER — Ambulatory Visit
Admission: RE | Admit: 2020-12-10 | Discharge: 2020-12-10 | Disposition: A | Discharge status: Home / Self Care | Payer: BC Managed Care – PPO | Source: Ambulatory Visit

## 2020-12-10 ENCOUNTER — Other Ambulatory Visit: Payer: Self-pay

## 2020-12-10 DIAGNOSIS — Z1231 Encounter for screening mammogram for malignant neoplasm of breast: Secondary | ICD-10-CM

## 2020-12-11 DIAGNOSIS — R0789 Other chest pain: Secondary | ICD-10-CM | POA: Diagnosis not present

## 2020-12-11 DIAGNOSIS — K219 Gastro-esophageal reflux disease without esophagitis: Secondary | ICD-10-CM | POA: Diagnosis not present

## 2020-12-17 ENCOUNTER — Ambulatory Visit: Payer: BC Managed Care – PPO | Admitting: Nurse Practitioner

## 2020-12-17 ENCOUNTER — Encounter: Payer: Self-pay | Admitting: Nurse Practitioner

## 2020-12-17 ENCOUNTER — Ambulatory Visit (INDEPENDENT_AMBULATORY_CARE_PROVIDER_SITE_OTHER): Payer: BC Managed Care – PPO | Admitting: Nurse Practitioner

## 2020-12-17 ENCOUNTER — Other Ambulatory Visit: Payer: Self-pay

## 2020-12-17 VITALS — BP 122/80 | Ht 62.0 in | Wt 160.0 lb

## 2020-12-17 DIAGNOSIS — N952 Postmenopausal atrophic vaginitis: Secondary | ICD-10-CM

## 2020-12-17 DIAGNOSIS — Z01419 Encounter for gynecological examination (general) (routine) without abnormal findings: Secondary | ICD-10-CM

## 2020-12-17 DIAGNOSIS — Z780 Asymptomatic menopausal state: Secondary | ICD-10-CM

## 2020-12-17 MED ORDER — ESTRADIOL 0.1 MG/GM VA CREA: 1.0000 g | TOPICAL_CREAM | VAGINAL | 12 refills | Status: DC

## 2020-12-17 NOTE — Progress Notes (Signed)
   Jessica Meza 1961/01/02 [id number]   History:  60 y.o. G1P0010 presents for annual exam. Postmenopausal. Using vaginal estrogen for dryness and pain with intercourse. Normal pap and mammogram history.   Gynecologic History Patient's last menstrual period was 07/09/2010.   Contraception/Family planning: post menopausal status Sexually active: Yes  Health Maintenance Last Pap: 12/14/2018. Results were: Normal, 3-year repeat Last mammogram: 12/10/2020. Results were: Normal Last colonoscopy: 2018. Results were: Normal, 10-year recall Last Dexa: 2021. Results were: Mild osteopenia per patient  Past medical history, past surgical history, family history and social history were all reviewed and documented in the EPIC chart. Married. 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 Mother (84) and PGM with history of breast cancer.   ROS:  A ROS was performed and pertinent positives and negatives are included.  Exam:  Vitals:   12/17/20 0929  BP: 122/80  Weight: 160 lb (72.6 kg)  Height: 5\' 2"  (1.575 m)   Body mass index is 29.26 kg/m.  General appearance:  Normal Thyroid:  Symmetrical, normal in size, without palpable masses or nodularity. Respiratory  Auscultation:  Clear without wheezing or rhonchi Cardiovascular  Auscultation:  Regular rate, without rubs, murmurs or gallops  Edema/varicosities:  Not grossly evident Abdominal  Soft,nontender, without masses, guarding or rebound.  Liver/spleen:  No organomegaly noted  Hernia:  None appreciated  Skin  Inspection:  Grossly normal Breasts: Examined lying and sitting.   Right: Without masses, retractions, nipple discharge or axillary adenopathy.   Left: Without masses, retractions, nipple discharge or axillary adenopathy. Genitourinary   Inguinal/mons:  Normal without inguinal adenopathy  External genitalia:  Normal appearing vulva with no masses, tenderness, or lesions  BUS/Urethra/Skene's glands:   Normal  Vagina: Atrophic changes, asymptomatic small urethral caruncle   Cervix:  Normal appearing without discharge or lesions  Uterus:  Normal in size, shape and contour.  Midline and mobile, nontender  Adnexa/parametria:     Rt: Normal in size, without masses or tenderness.   Lt: Normal in size, without masses or tenderness.  Anus and perineum: Normal  Digital rectal exam: Normal sphincter tone without palpated masses or tenderness  Patient informed chaperone available to be present for breast and pelvic exam. Patient has requested no chaperone to be present. Patient has been advised what will be completed during breast and pelvic exam.   Assessment/Plan:  60 y.o. G1P0010 for annual exam.   Well female exam with routine gynecological exam - Education provided on SBEs, importance of preventative screenings, current guidelines, high calcium diet, regular exercise, and multivitamin daily. Labs with PCP.   Post-menopausal atrophic vaginitis - Plan: estradiol (ESTRACE) 0.1 MG/GM vaginal cream twice weekly. She has been using Yuvafem but it is expensive. Insurance covers estrace cream so she would like to try this.   Postmenopausal - no bleeding.   Screening for cervical cancer - Normal Pap history.  Will repeat at 3-year interval per guidelines.  Screening for breast cancer - Normal mammogram history.  Continue annual screenings.  Normal breast exam today.  Screening for colon cancer - 2018 colonoscopy. Will repeat at GI's recommended interval.   Screening for osteoporosis - DXA last year with PCP. Reports mild osteopenia and is doing Vitamin D + Calcium and exercise for management.   Return in 1 year for annual.   2019 DNP, 10:34 AM 12/17/2020

## 2021-03-25 DIAGNOSIS — D2261 Melanocytic nevi of right upper limb, including shoulder: Secondary | ICD-10-CM | POA: Diagnosis not present

## 2021-03-25 DIAGNOSIS — L7211 Pilar cyst: Secondary | ICD-10-CM | POA: Diagnosis not present

## 2021-03-25 DIAGNOSIS — D225 Melanocytic nevi of trunk: Secondary | ICD-10-CM | POA: Diagnosis not present

## 2021-03-25 DIAGNOSIS — L812 Freckles: Secondary | ICD-10-CM | POA: Diagnosis not present

## 2021-03-30 ENCOUNTER — Telehealth: Payer: Self-pay

## 2021-03-30 ENCOUNTER — Other Ambulatory Visit: Payer: Self-pay | Admitting: Nurse Practitioner

## 2021-03-30 DIAGNOSIS — N952 Postmenopausal atrophic vaginitis: Secondary | ICD-10-CM

## 2021-03-30 MED ORDER — ESTRADIOL 10 MCG VA TABS: 10.0000 ug | ORAL_TABLET | VAGINAL | 2 refills | Status: DC

## 2021-03-30 NOTE — Telephone Encounter (Signed)
Spoke with patient and informed her. °

## 2021-03-30 NOTE — Telephone Encounter (Signed)
New prescription for [employer] provided. Thank you.

## 2021-03-30 NOTE — Telephone Encounter (Signed)
Patient called in voice mail. At 12/17/20 visit with Elmarie Shiley, NP: "Post-menopausal atrophic vaginitis - Plan: estradiol (ESTRACE) 0.1 MG/GM vaginal cream twice weekly. She has been using Yuvafem but it is expensive. Insurance covers estrace cream so she would like to try this. "  Patient said she has decided she would like to go back on the [place] and she said TW said that would be no problem just call and let her know.  She wants it sent to [facility] and I set her pharmacy for that.

## 2021-04-29 ENCOUNTER — Telehealth: Payer: Self-pay

## 2021-04-29 NOTE — Telephone Encounter (Signed)
Estradiol and Yuvafem are both generics for Vagifem, so these are just different generics and will have the same effects. ?

## 2021-04-29 NOTE — Telephone Encounter (Signed)
Pt calling with concerns about Yuvafem Rx. Pt states she has been taking for years and the last time she was prescribed, she asked for [place] to send it to [facility] due to cheaper price at that location. However, when she picked it up, she was given estradiol name instead of yuvafem. Pt has concerns if the effects of medication are the same or if you would consider the estradiol version better/worse? Please advise. Thanks.  ?

## 2021-04-29 NOTE — Telephone Encounter (Signed)
Pt notified and voiced understanding 

## 2021-05-20 DIAGNOSIS — E785 Hyperlipidemia, unspecified: Secondary | ICD-10-CM | POA: Diagnosis not present

## 2021-05-27 ENCOUNTER — Other Ambulatory Visit: Payer: Self-pay | Admitting: Internal Medicine

## 2021-05-27 DIAGNOSIS — E785 Hyperlipidemia, unspecified: Secondary | ICD-10-CM

## 2021-05-27 DIAGNOSIS — E538 Deficiency of other specified B group vitamins: Secondary | ICD-10-CM | POA: Diagnosis not present

## 2021-05-27 DIAGNOSIS — R82998 Other abnormal findings in urine: Secondary | ICD-10-CM | POA: Diagnosis not present

## 2021-05-27 DIAGNOSIS — E559 Vitamin D deficiency, unspecified: Secondary | ICD-10-CM | POA: Diagnosis not present

## 2021-05-27 DIAGNOSIS — Z0000 Encounter for general adult medical examination without abnormal findings: Secondary | ICD-10-CM | POA: Diagnosis not present

## 2021-05-27 DIAGNOSIS — Z1389 Encounter for screening for other disorder: Secondary | ICD-10-CM | POA: Diagnosis not present

## 2021-05-27 DIAGNOSIS — Z1331 Encounter for screening for depression: Secondary | ICD-10-CM | POA: Diagnosis not present

## 2021-08-02 ENCOUNTER — Ambulatory Visit
Admission: RE | Admit: 2021-08-02 | Discharge: 2021-08-02 | Disposition: A | Discharge status: Home / Self Care | Payer: BC Managed Care – PPO | Source: Ambulatory Visit | Attending: Internal Medicine | Admitting: Internal Medicine

## 2021-08-02 DIAGNOSIS — E785 Hyperlipidemia, unspecified: Secondary | ICD-10-CM

## 2021-08-02 DIAGNOSIS — R599 Enlarged lymph nodes, unspecified: Secondary | ICD-10-CM | POA: Diagnosis not present

## 2021-08-23 DIAGNOSIS — H40033 Anatomical narrow angle, bilateral: Secondary | ICD-10-CM | POA: Diagnosis not present

## 2021-08-23 DIAGNOSIS — H2513 Age-related nuclear cataract, bilateral: Secondary | ICD-10-CM | POA: Diagnosis not present

## 2021-08-23 DIAGNOSIS — H524 Presbyopia: Secondary | ICD-10-CM | POA: Diagnosis not present

## 2021-10-27 ENCOUNTER — Other Ambulatory Visit: Payer: Self-pay | Admitting: Internal Medicine

## 2021-10-27 DIAGNOSIS — Z1231 Encounter for screening mammogram for malignant neoplasm of breast: Secondary | ICD-10-CM

## 2021-11-24 IMAGING — MG DIGITAL SCREENING BILAT W/ TOMO W/ CAD
6 of 10 series · 6 of 30 positions shown · non-contrast
Comparison: Previous exam(s).

CLINICAL DATA: Screening.

EXAM:
DIGITAL SCREENING BILATERAL MAMMOGRAM WITH TOMO AND CAD

[R MLO synth-2D (1 of 2)]
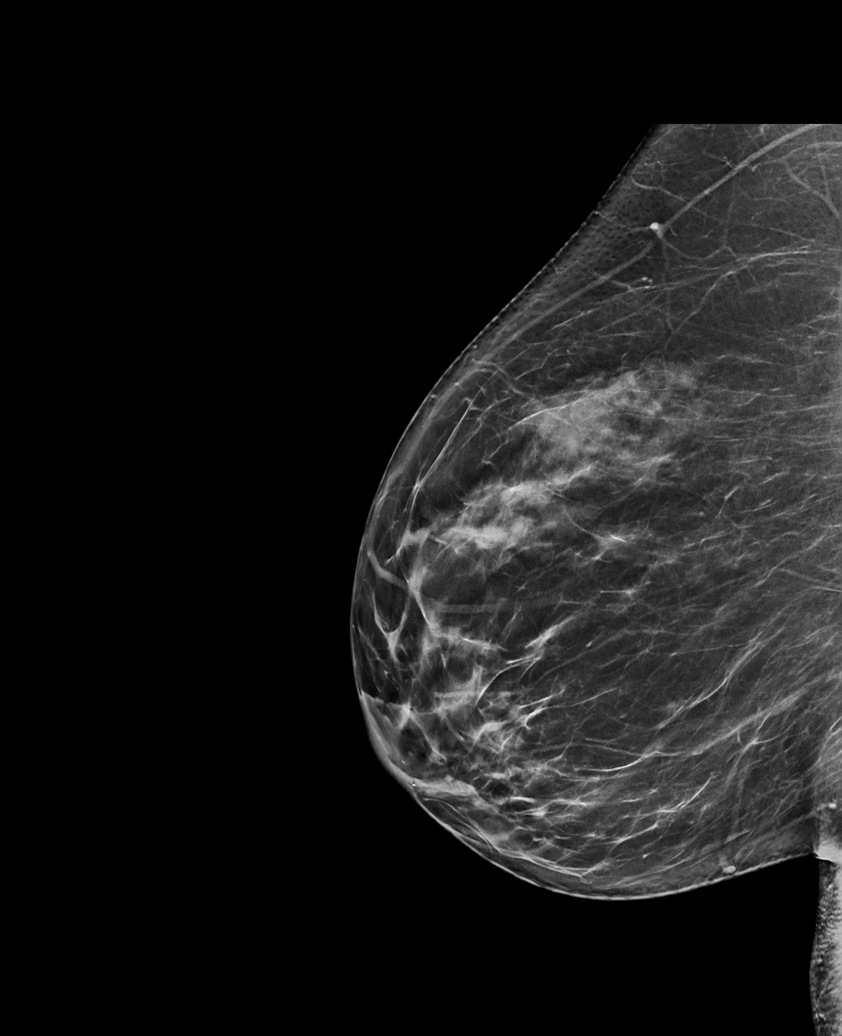

[R CC synth-2D]
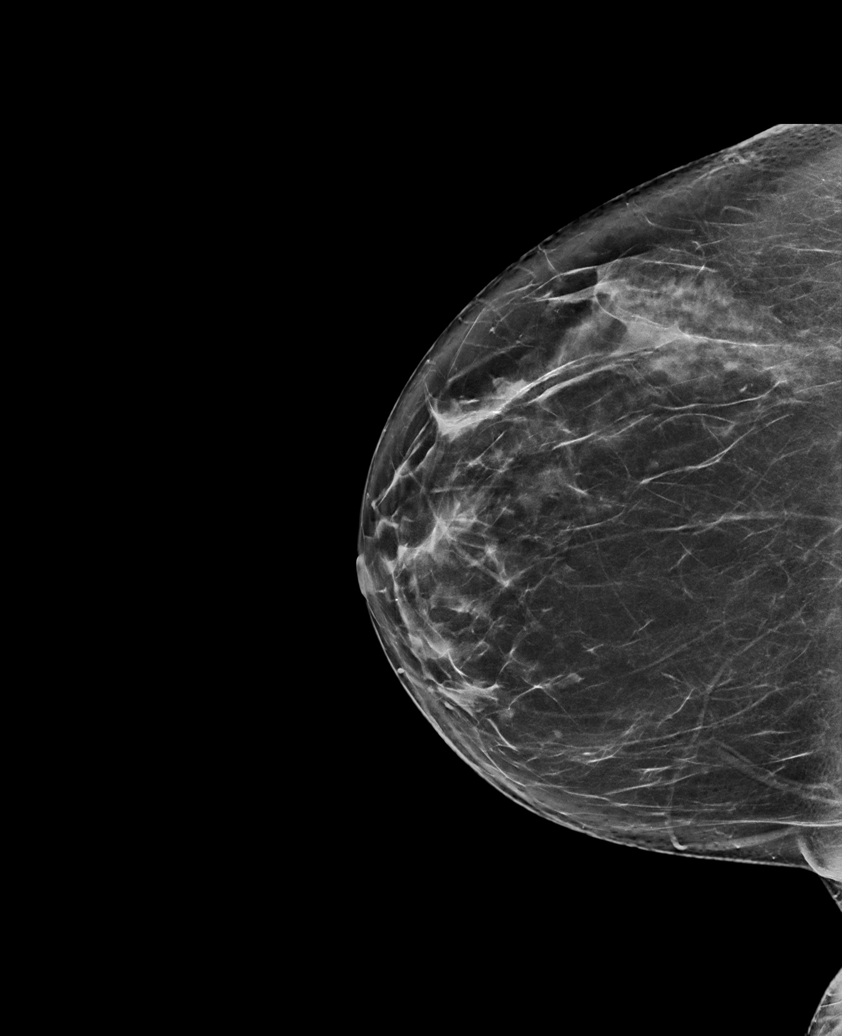

[L MLO synth-2D]
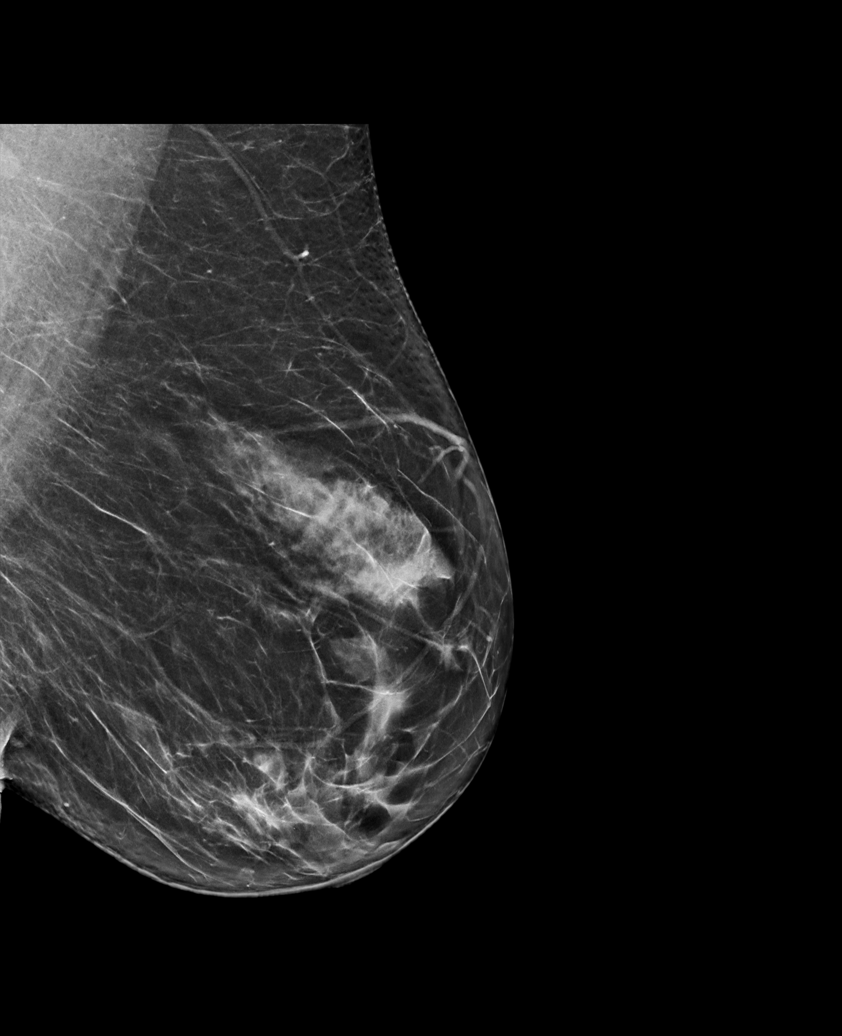

[R MLO synth-2D (2 of 2)]
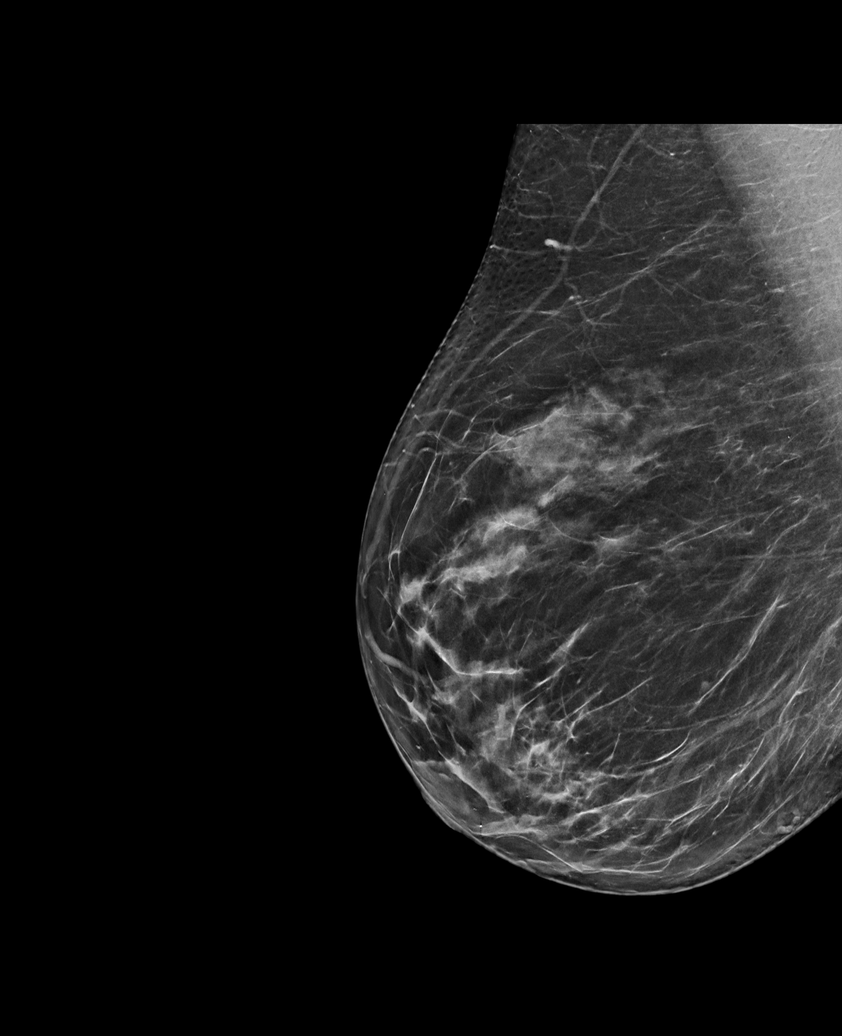

[L CC synth-2D]
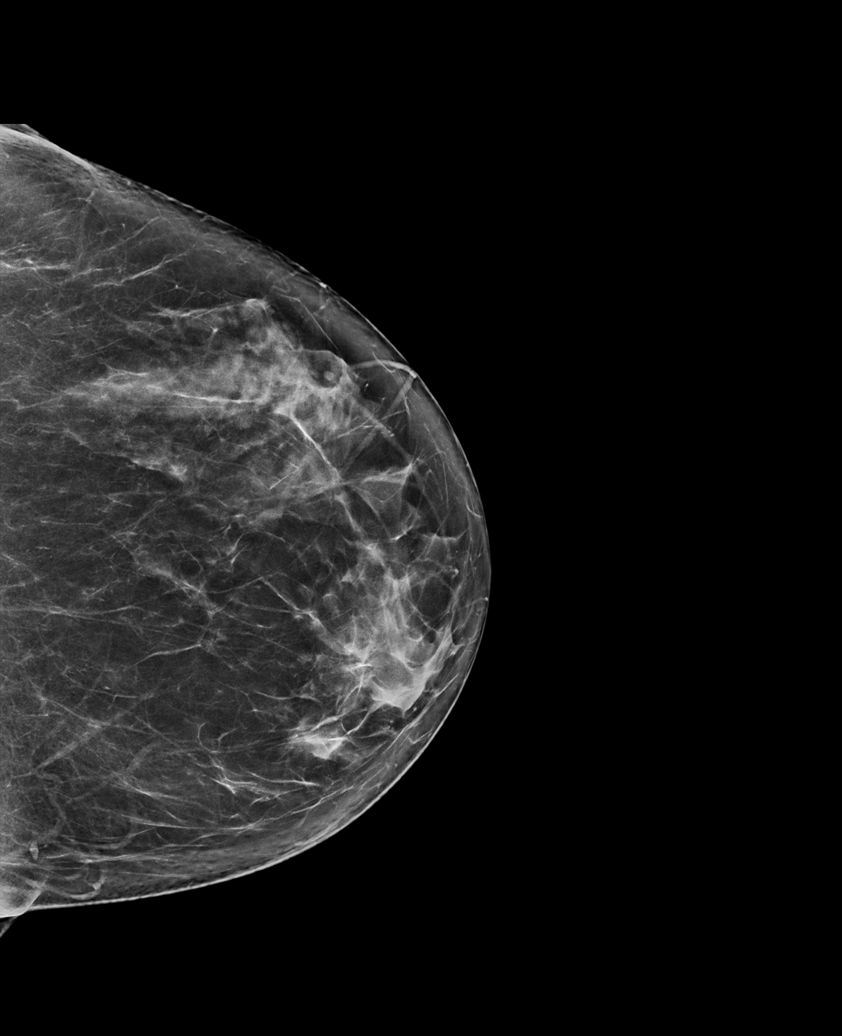

[L MLO tomo · tomo slice 41/82.0]
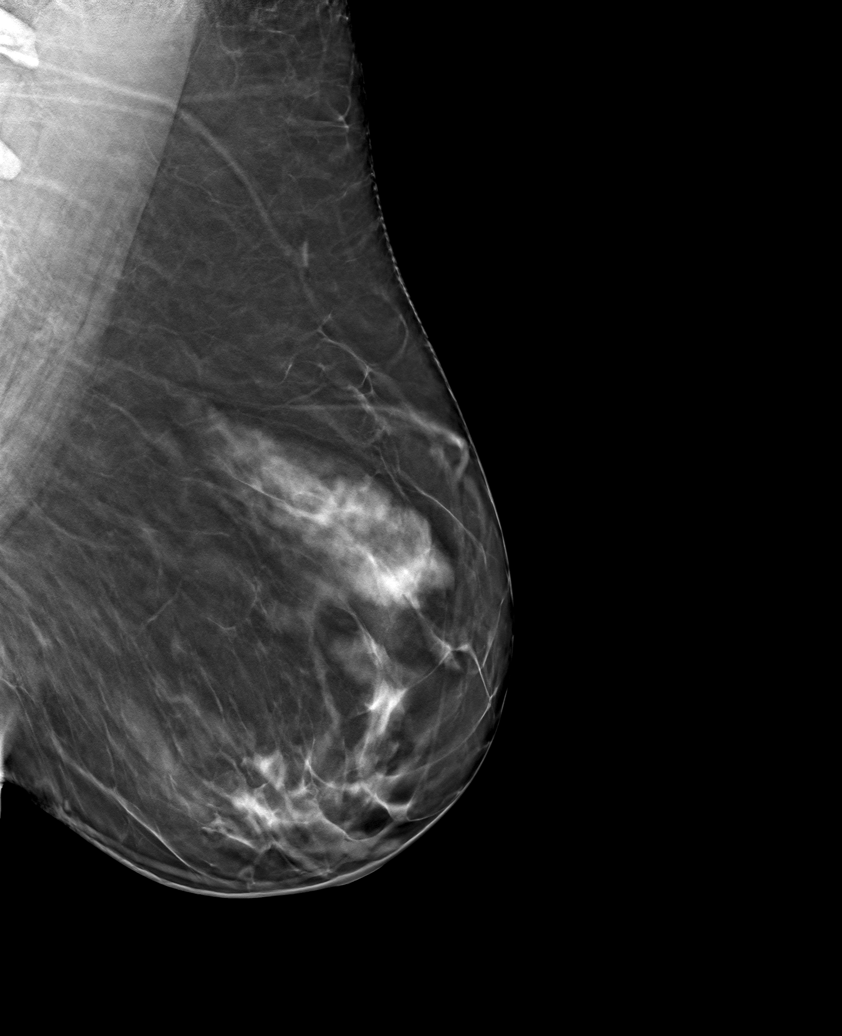

[6 of 30 positions shown; findings below may reference images not displayed]

ACR Breast Density Category b: There are scattered areas of
fibroglandular density.
FINDINGS: There are no findings suspicious for malignancy. Images were
processed with CAD.
IMPRESSION: No mammographic evidence of malignancy. A result letter of this
screening mammogram will be mailed directly to the patient.

RECOMMENDATION:
Screening mammogram in one year. (Code:[id number])

BI-RADS CATEGORY  1: Negative.

## 2021-12-13 ENCOUNTER — Ambulatory Visit
Admission: RE | Admit: 2021-12-13 | Discharge: 2021-12-13 | Disposition: A | Discharge status: Home / Self Care | Payer: BC Managed Care – PPO | Source: Ambulatory Visit | Attending: Internal Medicine | Admitting: Internal Medicine

## 2021-12-13 DIAGNOSIS — Z1231 Encounter for screening mammogram for malignant neoplasm of breast: Secondary | ICD-10-CM

## 2021-12-23 ENCOUNTER — Other Ambulatory Visit (HOSPITAL_COMMUNITY)
Admission: RE | Admit: 2021-12-23 | Discharge: 2021-12-23 | Disposition: A | Discharge status: Home / Self Care | Payer: BC Managed Care – PPO | Source: Ambulatory Visit | Attending: Nurse Practitioner | Admitting: Nurse Practitioner

## 2021-12-23 ENCOUNTER — Encounter: Payer: Self-pay | Admitting: Nurse Practitioner

## 2021-12-23 ENCOUNTER — Ambulatory Visit (INDEPENDENT_AMBULATORY_CARE_PROVIDER_SITE_OTHER): Payer: BC Managed Care – PPO | Admitting: Nurse Practitioner

## 2021-12-23 VITALS — BP 120/64 | HR 76 | Resp 20 | Ht 61.42 in | Wt 164.6 lb

## 2021-12-23 DIAGNOSIS — M8580 Other specified disorders of bone density and structure, unspecified site: Secondary | ICD-10-CM

## 2021-12-23 DIAGNOSIS — N952 Postmenopausal atrophic vaginitis: Secondary | ICD-10-CM

## 2021-12-23 DIAGNOSIS — Z01419 Encounter for gynecological examination (general) (routine) without abnormal findings: Secondary | ICD-10-CM | POA: Insufficient documentation

## 2021-12-23 MED ORDER — ESTRADIOL 10 MCG VA TABS: 10.0000 ug | ORAL_TABLET | VAGINAL | 4 refills | Status: DC

## 2021-12-23 NOTE — Progress Notes (Signed)
   SAMIA KUKLA September 14, 1960 [id number]   History:  61 y.o. G1P0010 presents for annual exam. Postmenopausal. Using vaginal estrogen for dryness and pain with intercourse. Normal pap and mammogram history. Husband just had knee replacement surgery.   Gynecologic History Patient's last menstrual period was 07/09/2010.   Contraception/Family planning: post menopausal status Sexually active: Yes  Health Maintenance Last Pap: 12/14/2018. Results were: Normal, 3-year repeat Last mammogram: 12/13/2021. Results were: Normal Last colonoscopy: 2018. Results were: Normal, 10-year recall Last Dexa: 2021. Results were: Mild osteopenia per patient. Scheduled in the spring with PCP.   Past medical history, past surgical history, family history and social history were all reviewed and documented in the EPIC chart. 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 2 stepchildren, 63 yo granddaughter, senior in [employer].  Mother (48) and PGM with history of breast cancer.   ROS:  A ROS was performed and pertinent positives and negatives are included.  Exam:  Vitals:   12/23/21 0923  BP: 120/64  Pulse: 76  Resp: 20  SpO2: 98%  Weight: 164 lb 9.6 oz (74.7 kg)  Height: 5' 1.42" (1.56 m)    Body mass index is 30.68 kg/m.  General appearance:  Normal Thyroid:  Symmetrical, normal in size, without palpable masses or nodularity. Respiratory  Auscultation:  Clear without wheezing or rhonchi Cardiovascular  Auscultation:  Regular rate, without rubs, murmurs or gallops  Edema/varicosities:  Not grossly evident Abdominal  Soft,nontender, without masses, guarding or rebound.  Liver/spleen:  No organomegaly noted  Hernia:  None appreciated  Skin  Inspection:  Grossly normal Breasts: Examined lying and sitting.   Right: Without masses, retractions, nipple discharge or axillary adenopathy.   Left: Without masses, retractions, nipple discharge or axillary  adenopathy. Genitourinary   Inguinal/mons:  Normal without inguinal adenopathy  External genitalia:  Normal appearing vulva with no masses, tenderness, or lesions  BUS/Urethra/Skene's glands:  Normal  Vagina: Atrophic changes, asymptomatic small urethral caruncle   Cervix:  Normal appearing without discharge or lesions. Stenotic  Uterus:  Normal in size, shape and contour.  Midline and mobile, nontender  Adnexa/parametria:     Rt: Normal in size, without masses or tenderness.   Lt: Normal in size, without masses or tenderness.  Anus and perineum: Normal  Digital rectal exam: Normal sphincter tone without palpated masses or tenderness  Patient informed chaperone available to be present for breast and pelvic exam. Patient has requested no chaperone to be present. Patient has been advised what will be completed during breast and pelvic exam.   Assessment/Plan:  61 y.o. G1P0010 for annual exam.   Well female exam with routine gynecological exam - Plan: Cytology - PAP( [facility]). Education provided on SBEs, importance of preventative screenings, current guidelines, high calcium diet, regular exercise, and multivitamin daily.  Labs with PCP.   Post-menopausal atrophic vaginitis - Plan: Estradiol (YUVAFEM) 10 MCG TABS vaginal tablet twice weekly. Good management wants to continue. Refill provided.   Osteopenia, unspecified location - managed by PCP. DXA scheduled in the spring. Continue Vitamin D + calcium and regular exercise.   Screening for cervical cancer - Normal Pap history.  Pap today.   Screening for breast cancer - Normal mammogram history.  Continue annual screenings.  Normal breast exam today.  Screening for colon cancer - 2018 colonoscopy. Will repeat at GI's recommended interval.   Return in 1 year for annual.     Olivia Mackie DNP, 9:49 AM 12/23/2021

## 2021-12-24 LAB — CYTOLOGY - PAP: Diagnosis: NEGATIVE

## 2022-04-11 DIAGNOSIS — R051 Acute cough: Secondary | ICD-10-CM | POA: Diagnosis not present

## 2022-04-11 DIAGNOSIS — U071 COVID-19: Secondary | ICD-10-CM | POA: Diagnosis not present

## 2022-04-28 DIAGNOSIS — D2261 Melanocytic nevi of right upper limb, including shoulder: Secondary | ICD-10-CM | POA: Diagnosis not present

## 2022-04-28 DIAGNOSIS — L821 Other seborrheic keratosis: Secondary | ICD-10-CM | POA: Diagnosis not present

## 2022-04-28 DIAGNOSIS — D225 Melanocytic nevi of trunk: Secondary | ICD-10-CM | POA: Diagnosis not present

## 2022-04-28 DIAGNOSIS — L723 Sebaceous cyst: Secondary | ICD-10-CM | POA: Diagnosis not present

## 2022-06-30 DIAGNOSIS — K219 Gastro-esophageal reflux disease without esophagitis: Secondary | ICD-10-CM | POA: Diagnosis not present

## 2022-06-30 DIAGNOSIS — Z1212 Encounter for screening for malignant neoplasm of rectum: Secondary | ICD-10-CM | POA: Diagnosis not present

## 2022-06-30 DIAGNOSIS — E785 Hyperlipidemia, unspecified: Secondary | ICD-10-CM | POA: Diagnosis not present

## 2022-06-30 DIAGNOSIS — M8589 Other specified disorders of bone density and structure, multiple sites: Secondary | ICD-10-CM | POA: Diagnosis not present

## 2022-06-30 DIAGNOSIS — I700 Atherosclerosis of aorta: Secondary | ICD-10-CM | POA: Diagnosis not present

## 2022-06-30 DIAGNOSIS — E559 Vitamin D deficiency, unspecified: Secondary | ICD-10-CM | POA: Diagnosis not present

## 2022-06-30 DIAGNOSIS — R7989 Other specified abnormal findings of blood chemistry: Secondary | ICD-10-CM | POA: Diagnosis not present

## 2022-07-07 DIAGNOSIS — R82998 Other abnormal findings in urine: Secondary | ICD-10-CM | POA: Diagnosis not present

## 2022-07-07 DIAGNOSIS — R030 Elevated blood-pressure reading, without diagnosis of hypertension: Secondary | ICD-10-CM | POA: Diagnosis not present

## 2022-07-07 DIAGNOSIS — M79643 Pain in unspecified hand: Secondary | ICD-10-CM | POA: Diagnosis not present

## 2022-07-07 DIAGNOSIS — E785 Hyperlipidemia, unspecified: Secondary | ICD-10-CM | POA: Diagnosis not present

## 2022-07-07 DIAGNOSIS — R002 Palpitations: Secondary | ICD-10-CM | POA: Diagnosis not present

## 2022-07-07 DIAGNOSIS — Z0000 Encounter for general adult medical examination without abnormal findings: Secondary | ICD-10-CM | POA: Diagnosis not present

## 2022-07-07 DIAGNOSIS — Z23 Encounter for immunization: Secondary | ICD-10-CM | POA: Diagnosis not present

## 2022-09-01 DIAGNOSIS — H524 Presbyopia: Secondary | ICD-10-CM | POA: Diagnosis not present

## 2022-09-01 DIAGNOSIS — H2513 Age-related nuclear cataract, bilateral: Secondary | ICD-10-CM | POA: Diagnosis not present

## 2022-11-09 DIAGNOSIS — Z23 Encounter for immunization: Secondary | ICD-10-CM | POA: Diagnosis not present

## 2022-11-16 ENCOUNTER — Other Ambulatory Visit: Payer: Self-pay | Admitting: Internal Medicine

## 2022-11-16 DIAGNOSIS — Z1231 Encounter for screening mammogram for malignant neoplasm of breast: Secondary | ICD-10-CM

## 2022-12-19 ENCOUNTER — Ambulatory Visit
Admission: RE | Admit: 2022-12-19 | Discharge: 2022-12-19 | Disposition: A | Discharge status: Home / Self Care | Payer: BC Managed Care – PPO | Source: Ambulatory Visit

## 2022-12-19 DIAGNOSIS — Z1231 Encounter for screening mammogram for malignant neoplasm of breast: Secondary | ICD-10-CM

## 2022-12-27 ENCOUNTER — Encounter: Payer: Self-pay | Admitting: Nurse Practitioner

## 2022-12-28 ENCOUNTER — Encounter: Payer: Self-pay | Admitting: Nurse Practitioner

## 2022-12-28 ENCOUNTER — Ambulatory Visit (INDEPENDENT_AMBULATORY_CARE_PROVIDER_SITE_OTHER): Payer: BC Managed Care – PPO | Admitting: Nurse Practitioner

## 2022-12-28 VITALS — BP 120/82 | HR 84 | Ht 62.0 in | Wt 170.0 lb

## 2022-12-28 DIAGNOSIS — N952 Postmenopausal atrophic vaginitis: Secondary | ICD-10-CM

## 2022-12-28 DIAGNOSIS — M8580 Other specified disorders of bone density and structure, unspecified site: Secondary | ICD-10-CM

## 2022-12-28 DIAGNOSIS — Z01419 Encounter for gynecological examination (general) (routine) without abnormal findings: Secondary | ICD-10-CM | POA: Diagnosis not present

## 2022-12-28 MED ORDER — ESTRADIOL 10 MCG VA TABS: 10.0000 ug | ORAL_TABLET | VAGINAL | 4 refills | Status: DC

## 2022-12-28 NOTE — Progress Notes (Signed)
ANNY GROGG 11/11/1960 [id number]   History:  62 y.o. G1P0010 presents for annual exam. Postmenopausal. Using vaginal estrogen for dryness and pain with intercourse. Normal pap and mammogram history. HLD, GERD managed by PCP.   Gynecologic History Patient's last menstrual period was 07/09/2010.   Contraception/Family planning: post menopausal status Sexually active: No, husband being treated for prostate cancer  Health Maintenance Last Pap: 12/23/2021. Results were: Normal, 3-year repeat Last mammogram: 12/19/2022. Results were: Normal Last colonoscopy: 2018. Results were: Normal, 10-year recall Last Dexa: 2024. Results were: Osteopenia   Past medical history, past surgical history, family history and social history were all reviewed and documented in the EPIC chart. 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 2 stepchildren, 44 yo granddaughter, at [employer] for [employer], plans for law school. Mother (20) and PGM with history of breast cancer.   ROS:  A ROS was performed and pertinent positives and negatives are included.  Exam:  Vitals:   12/28/22 0933  BP: 120/82  Pulse: 84  SpO2: 97%  Weight: 170 lb (77.1 kg)  Height: 5\' 2"  (1.575 m)     Body mass index is 31.09 kg/m.  General appearance:  Normal Thyroid:  Symmetrical, normal in size, without palpable masses or nodularity. Respiratory  Auscultation:  Clear without wheezing or rhonchi Cardiovascular  Auscultation:  Regular rate, without rubs, murmurs or gallops  Edema/varicosities:  Not grossly evident Abdominal  Soft,nontender, without masses, guarding or rebound.  Liver/spleen:  No organomegaly noted  Hernia:  None appreciated  Skin  Inspection:  Grossly normal Breasts: Examined lying and sitting.   Right: Without masses, retractions, nipple discharge or axillary adenopathy.   Left: Without masses, retractions, nipple discharge or axillary adenopathy. Pelvic:  External genitalia:  no lesions              Urethra:  normal appearing urethra with no masses, tenderness or lesions              Bartholins and Skenes: normal                 Vagina: normal appearing vagina with normal color and discharge, no lesions. Atrophic changes              Cervix: no lesions Bimanual Exam:  Uterus:  no masses or tenderness              Adnexa: no mass, fullness, tenderness              Rectovaginal: Deferred              Anus:  normal, no lesions  Patient informed chaperone available to be present for breast and pelvic exam. Patient has requested no chaperone to be present. Patient has been advised what will be completed during breast and pelvic exam.   Assessment/Plan:  62 y.o. G1P0010 for annual exam.   Well female exam with routine gynecological exam - Education provided on SBEs, importance of preventative screenings, current guidelines, high calcium diet, regular exercise, and multivitamin daily.  Labs with PCP.   Vaginal atrophy - Plan: Estradiol (YUVAFEM) 10 MCG TABS vaginal tablet twice weekly. Good management wants to continue. Refill provided.   Osteopenia, unspecified location - DXA 2024. managed by PCP. Continue Vitamin D + calcium and regular exercise.   Screening for cervical cancer - Normal Pap history. Will repeat at 3-year interval per guidelines.   Screening for breast cancer - Normal mammogram history.  Continue annual  screenings.  Normal breast exam today.  Screening for colon cancer - 2018 colonoscopy. Will repeat at GI's recommended interval.   Return in about 1 year (around 12/28/2023) for Annual.     Olivia Mackie DNP, 9:45 AM 12/28/2022

## 2023-01-11 DIAGNOSIS — K219 Gastro-esophageal reflux disease without esophagitis: Secondary | ICD-10-CM | POA: Diagnosis not present

## 2023-05-15 DIAGNOSIS — L821 Other seborrheic keratosis: Secondary | ICD-10-CM | POA: Diagnosis not present

## 2023-05-15 DIAGNOSIS — D225 Melanocytic nevi of trunk: Secondary | ICD-10-CM | POA: Diagnosis not present

## 2023-05-15 DIAGNOSIS — L723 Sebaceous cyst: Secondary | ICD-10-CM | POA: Diagnosis not present

## 2023-05-15 DIAGNOSIS — L812 Freckles: Secondary | ICD-10-CM | POA: Diagnosis not present

## 2023-10-02 DIAGNOSIS — Z1212 Encounter for screening for malignant neoplasm of rectum: Secondary | ICD-10-CM | POA: Diagnosis not present

## 2023-10-02 DIAGNOSIS — E785 Hyperlipidemia, unspecified: Secondary | ICD-10-CM | POA: Diagnosis not present

## 2023-10-02 DIAGNOSIS — E559 Vitamin D deficiency, unspecified: Secondary | ICD-10-CM | POA: Diagnosis not present

## 2023-10-10 DIAGNOSIS — K219 Gastro-esophageal reflux disease without esophagitis: Secondary | ICD-10-CM | POA: Diagnosis not present

## 2023-10-10 DIAGNOSIS — Z0000 Encounter for general adult medical examination without abnormal findings: Secondary | ICD-10-CM | POA: Diagnosis not present

## 2023-10-10 DIAGNOSIS — Z23 Encounter for immunization: Secondary | ICD-10-CM | POA: Diagnosis not present

## 2023-10-10 DIAGNOSIS — R7301 Impaired fasting glucose: Secondary | ICD-10-CM | POA: Diagnosis not present

## 2023-10-10 DIAGNOSIS — Z1331 Encounter for screening for depression: Secondary | ICD-10-CM | POA: Diagnosis not present

## 2023-11-13 ENCOUNTER — Other Ambulatory Visit: Payer: Self-pay | Admitting: Internal Medicine

## 2023-11-13 DIAGNOSIS — Z1231 Encounter for screening mammogram for malignant neoplasm of breast: Secondary | ICD-10-CM

## 2023-12-21 ENCOUNTER — Ambulatory Visit
Admission: RE | Admit: 2023-12-21 | Discharge: 2023-12-21 | Disposition: A | Source: Ambulatory Visit | Attending: Internal Medicine | Admitting: Internal Medicine

## 2023-12-21 DIAGNOSIS — Z1231 Encounter for screening mammogram for malignant neoplasm of breast: Secondary | ICD-10-CM

## 2024-01-01 ENCOUNTER — Ambulatory Visit: Payer: BC Managed Care – PPO | Admitting: Nurse Practitioner

## 2024-01-10 DIAGNOSIS — K219 Gastro-esophageal reflux disease without esophagitis: Secondary | ICD-10-CM | POA: Diagnosis not present

## 2024-01-10 DIAGNOSIS — K5900 Constipation, unspecified: Secondary | ICD-10-CM | POA: Diagnosis not present

## 2024-01-17 ENCOUNTER — Ambulatory Visit (INDEPENDENT_AMBULATORY_CARE_PROVIDER_SITE_OTHER): Admitting: Nurse Practitioner

## 2024-01-17 ENCOUNTER — Encounter: Payer: Self-pay | Admitting: Nurse Practitioner

## 2024-01-17 VITALS — BP 122/76 | HR 84 | Ht 61.5 in | Wt 169.0 lb

## 2024-01-17 DIAGNOSIS — N952 Postmenopausal atrophic vaginitis: Secondary | ICD-10-CM

## 2024-01-17 DIAGNOSIS — Z1331 Encounter for screening for depression: Secondary | ICD-10-CM | POA: Diagnosis not present

## 2024-01-17 DIAGNOSIS — M8580 Other specified disorders of bone density and structure, unspecified site: Secondary | ICD-10-CM | POA: Diagnosis not present

## 2024-01-17 DIAGNOSIS — Z01419 Encounter for gynecological examination (general) (routine) without abnormal findings: Secondary | ICD-10-CM | POA: Diagnosis not present

## 2024-01-17 DIAGNOSIS — Z780 Asymptomatic menopausal state: Secondary | ICD-10-CM

## 2024-01-17 MED ORDER — ESTRADIOL 10 MCG VA TABS: 10.0000 ug | ORAL_TABLET | VAGINAL | 4 refills | Status: AC

## 2024-01-17 NOTE — Progress Notes (Signed)
 Jessica Meza [birth year] [id number]   History:  63 y.o. G1P0010 presents for annual exam. Postmenopausal. Using vaginal estrogen for dryness and pain with intercourse. Normal pap history. HLD, GERD managed by PCP.   Gynecologic History Patient's last menstrual period was 07/09/2010.   Contraception/Family planning: post menopausal status Sexually active: No, husband being treated for prostate cancer  Health Maintenance Last Pap: 12/23/2021. Results were: Normal, 3-year repeat Last mammogram: 12/21/2023. Results were: Normal Last colonoscopy: 2018. Results were: Normal, 10-year recall Last Dexa: 2024. Results were: Osteopenia      01/17/2024   11:27 AM  Depression screen PHQ 2/9  Decreased Interest 0  Down, Depressed, Hopeless 0  PHQ - 2 Score 0     Past medical history, past surgical history, family history and social history were all reviewed and documented in the EPIC chart. 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 2 stepchildren, 34 yo granddaughter, at [employer] for auto-owners insurance, plans for law school. Mother (63) and PGM with history of breast cancer.   ROS:  A ROS was performed and pertinent positives and negatives are included.  Exam:  Vitals:   01/17/24 1122  BP: 122/76  Pulse: 84  SpO2: 97%  Weight: 169 lb (76.7 kg)  Height: 5' 1.5 (1.562 m)      Body mass index is 31.42 kg/m.  General appearance:  Normal Thyroid :  Symmetrical, normal in size, without palpable masses or nodularity. Respiratory  Auscultation:  Clear without wheezing or rhonchi Cardiovascular  Auscultation:  Regular rate, without rubs, murmurs or gallops  Edema/varicosities:  Not grossly evident Abdominal  Soft,nontender, without masses, guarding or rebound.  Liver/spleen:  No organomegaly noted  Hernia:  None appreciated  Skin  Inspection:  Grossly normal Breasts: Examined lying and  sitting.   Right: Without masses, retractions, nipple discharge or axillary adenopathy.   Left: Without masses, retractions, nipple discharge or axillary adenopathy. Pelvic: External genitalia:  no lesions              Urethra:  normal appearing urethra with no masses, tenderness or lesions              Bartholins and Skenes: normal                 Vagina: normal appearing vagina with normal color and discharge, no lesions. Atrophic changes              Cervix: no lesions Bimanual Exam:  Uterus:  no masses or tenderness              Adnexa: no mass, fullness, tenderness              Rectovaginal: Deferred              Anus:  normal, no lesions  Jessica Meza, CMA present as chaperone.   Assessment/Plan:  63 y.o. G1P0010 for annual exam.   Well female exam with routine gynecological exam - Education provided on SBEs, importance of preventative screenings, current guidelines, high calcium diet, regular exercise, and multivitamin daily.  Labs with PCP.   Depression screening - PHQ - 0  Vaginal atrophy - Plan: Estradiol  (YUVAFEM ) 10 MCG TABS vaginal tablet twice weekly. Good management wants to continue. Refill provided.   Osteopenia, unspecified location - DXA 2024. managed by PCP. Continue Vitamin D + calcium and regular exercise.   Screening for cervical cancer - Normal Pap history. Will repeat at  3-year interval per guidelines.   Screening for breast cancer - Normal mammogram history.  Continue annual screenings.  Normal breast exam today.  Screening for colon cancer - 2018 colonoscopy. Will repeat at GI's recommended interval.   Return in about 1 year (around 01/16/2025) for Annual.     Jessica DELENA Shutter DNP, 11:48 AM 01/17/2024

## 2025-01-22 ENCOUNTER — Ambulatory Visit: Admitting: Nurse Practitioner
